# Patient Record
Sex: Female | Born: 1950 | Race: White | Hispanic: No | Marital: Married | Smoking: Never smoker
Health system: Southern US, Community
[De-identification: ages and names within clinical notes are randomized; demographics above are authoritative.]

## PROBLEM LIST (undated history)

## (undated) DIAGNOSIS — Z860100 Personal history of colon polyps, unspecified: Secondary | ICD-10-CM

## (undated) DIAGNOSIS — I10 Essential (primary) hypertension: Secondary | ICD-10-CM

## (undated) DIAGNOSIS — K5730 Diverticulosis of large intestine without perforation or abscess without bleeding: Secondary | ICD-10-CM

## (undated) DIAGNOSIS — K6389 Other specified diseases of intestine: Secondary | ICD-10-CM

## (undated) DIAGNOSIS — E785 Hyperlipidemia, unspecified: Secondary | ICD-10-CM

## (undated) HISTORY — DX: Personal history of colon polyps, unspecified: Z86.0100

## (undated) HISTORY — PX: OTHER SURGICAL HISTORY: SHX169

## (undated) HISTORY — PX: POLYPECTOMY: SHX149

## (undated) HISTORY — PX: COLONOSCOPY: SHX174

## (undated) HISTORY — DX: Personal history of colonic polyps: Z86.010

## (undated) HISTORY — DX: Hyperlipidemia, unspecified: E78.5

## (undated) HISTORY — DX: Diverticulosis of large intestine without perforation or abscess without bleeding: K57.30

## (undated) HISTORY — PX: ABDOMINAL HYSTERECTOMY: SHX81

## (undated) HISTORY — PX: WISDOM TOOTH EXTRACTION: SHX21

## (undated) HISTORY — DX: Other specified diseases of intestine: K63.89

## (undated) HISTORY — DX: Essential (primary) hypertension: I10

---

## 2016-08-28 DIAGNOSIS — K5730 Diverticulosis of large intestine without perforation or abscess without bleeding: Secondary | ICD-10-CM

## 2016-08-28 HISTORY — DX: Diverticulosis of large intestine without perforation or abscess without bleeding: K57.30

## 2016-09-09 DIAGNOSIS — Z860100 Personal history of colon polyps, unspecified: Secondary | ICD-10-CM | POA: Insufficient documentation

## 2016-09-09 DIAGNOSIS — Z1211 Encounter for screening for malignant neoplasm of colon: Secondary | ICD-10-CM | POA: Insufficient documentation

## 2016-12-26 DIAGNOSIS — E785 Hyperlipidemia, unspecified: Secondary | ICD-10-CM | POA: Insufficient documentation

## 2016-12-26 DIAGNOSIS — I10 Essential (primary) hypertension: Secondary | ICD-10-CM | POA: Insufficient documentation

## 2017-06-13 ENCOUNTER — Telehealth: Payer: Self-pay | Admitting: Gastroenterology

## 2017-06-13 NOTE — Telephone Encounter (Signed)
Received referral for patient to have next colon with [facility]. Patient states last colon was April 2018. Patient requesting Dr.Armbruster so referral and previous gi records placed on his desk for review. Pt requesting April appt.

## 2017-06-16 ENCOUNTER — Encounter: Payer: Self-pay | Admitting: Gastroenterology

## 2017-06-16 NOTE — Telephone Encounter (Signed)
Dr.Armbruster reviewed records and accepted patient for an ov. Patient was notified and scheduled.

## 2017-06-27 ENCOUNTER — Other Ambulatory Visit: Payer: Self-pay | Admitting: Physician Assistant

## 2017-06-27 DIAGNOSIS — Z139 Encounter for screening, unspecified: Secondary | ICD-10-CM

## 2017-07-17 ENCOUNTER — Ambulatory Visit
Admission: RE | Admit: 2017-07-17 | Discharge: 2017-07-17 | Disposition: A | Discharge status: Home / Self Care | Payer: Medicare Other | Source: Ambulatory Visit | Attending: Physician Assistant | Admitting: Physician Assistant

## 2017-07-17 ENCOUNTER — Encounter: Payer: Self-pay | Admitting: Radiology

## 2017-07-17 DIAGNOSIS — Z139 Encounter for screening, unspecified: Secondary | ICD-10-CM

## 2017-08-01 NOTE — Progress Notes (Signed)
Referral paperwork loaded

## 2017-08-08 ENCOUNTER — Encounter: Payer: Self-pay | Admitting: Gastroenterology

## 2017-08-08 ENCOUNTER — Ambulatory Visit (INDEPENDENT_AMBULATORY_CARE_PROVIDER_SITE_OTHER): Payer: Medicare Other | Admitting: Gastroenterology

## 2017-08-08 VITALS — BP 110/68 | HR 62 | Ht 66.0 in | Wt 159.2 lb

## 2017-08-08 DIAGNOSIS — K6389 Other specified diseases of intestine: Secondary | ICD-10-CM | POA: Diagnosis not present

## 2017-08-08 DIAGNOSIS — R1013 Epigastric pain: Secondary | ICD-10-CM | POA: Diagnosis not present

## 2017-08-08 MED ORDER — SUPREP BOWEL PREP KIT 17.5-3.13-1.6 GM/177ML PO SOLN: ORAL | 0 refills | Status: DC

## 2017-08-08 NOTE — Progress Notes (Signed)
HPI :  67 year old female with history of diverticulosis, small bowel polyp, hypertension, hyperlipidemia, referred by Nicholes Rough PA to establish GI care and follow-up for small bowel polyp.  She states at baseline she is feeling pretty well without any bowel problems at present.  She denies any abdominal pains.  No constipation, no diarrhea, no blood in the stools.  She has had some ongoing intermittent epigastric pain that bothers her at times.  She thinks some foods can produce this such as sugary foods or drink coffee.  She has tried Zegerid and Zantac in the past which have not helped.  She states she is only taking periodic usage of these and not used daily.  She endorses having hiccups frequently, but denies any heartburn or regurgitation.  She denies any dysphasia.  She had a H. pylori breath test recently which was negative.  She states the symptom has been ongoing on and off for a few years without any clear etiology.  Her last colonoscopy was done in April 2018 in [place].  She was found to have a broad-based 1 cm ileal polyp which was biopsied and reported to have normal mucosa.  She subsequently underwent a CT scan of the abdomen which did not show any small bowel pathology.  She was told to have interval follow-up for this lesion.  She has since moved to [place] and is here to establish care.  Colonoscopy 09/21/2016 - Dr. Amadeo Garnet - sigmoid diverticulum, broad based ileal polyp 86mm in size, otherwise normal - path is NORMAL  CT scan 10/06/2016 - normal, benign hepatic cysts, simple renal cysts, hysterectomy, L3-L5 spondylosis, small umbilical hernia      Past Medical History:  Diagnosis Date  . Diverticulosis of sigmoid colon 08/2016  . History of colon polyps   . Hyperlipemia   . Hypertension      Past Surgical History:  Procedure Laterality Date  . ABDOMINAL HYSTERECTOMY     Family History  Problem Relation Age of Onset  . Diabetes Mother   . Diabetes  Father   . High blood pressure Father   . High Cholesterol Father   . Diabetes Brother    Social History   Tobacco Use  . Smoking status: Never Smoker  . Smokeless tobacco: Never Used  Substance Use Topics  . Alcohol use: Yes    Alcohol/week: 0.6 oz    Types: 1 Glasses of wine per week  . Drug use: No   Current Outpatient Medications  Medication Sig Dispense Refill  . famciclovir (FAMVIR) 500 MG tablet Take by mouth daily.    . irbesartan (AVAPRO) 75 MG tablet Take 75 mg by mouth at bedtime.    . simvastatin (ZOCOR) 40 MG tablet Take 20 mg by mouth daily.    Marland Kitchen spironolactone (ALDACTONE) 25 MG tablet Take 25 mg by mouth daily.     No current facility-administered medications for this visit.    Allergies not on file   Review of Systems: All systems reviewed and negative except where noted in HPI.   No results found for: WBC, HGB, HCT, MCV, PLT  No results found for: CREATININE, BUN, NA, K, CL, CO2   Physical Exam: BP 110/68   Pulse 62   Ht 5\' 6"  (1.676 m)   Wt 159 lb 4 oz (72.2 kg)   BMI 25.70 kg/m  Constitutional: Pleasant,well-developed, female in no acute distress. HEENT: Normocephalic and atraumatic. Conjunctivae are normal. No scleral icterus. Neck supple.  Cardiovascular: Normal rate,  regular rhythm.  Pulmonary/chest: Effort normal and breath sounds normal. No wheezing, rales or rhonchi. Abdominal: Soft, nondistended, nontender. There are no masses palpable. No hepatomegaly. Extremities: no edema Lymphadenopathy: No cervical adenopathy noted. Neurological: Alert and oriented to person place and time. Skin: Skin is warm and dry. No rashes noted. Psychiatric: Normal mood and affect. Behavior is normal.   ASSESSMENT AND PLAN: 67 year old female with history as outlined above here to establish GI care for the following issues:  Small bowel polyp -1 cm ileal polyp noted incidentally on last colonoscopy.  Biopsies at that time were normal and a follow-up CT  scan showed no concerning pathology.  She is asymptomatic other than intermittent epigastric pain.  Given the size of this reported polyp I am recommending a colonoscopy to reassess this area given its been in 1 year since her last exam.  I discussed the risks and benefits of colonoscopy and she want to proceed.  Further recommendations pending this result  Epigastric pain - Intermittent postprandial pain.  Not improved by antacids however she has not had a trial of daily use with these.  Recommend she take her Zegerid every day for 2-4 weeks and see if she notes improvement.  No pathology noted on prior CT scan to account for this.  I otherwise offered her an upper endoscopy to further evaluate the symptoms, can be done at the same time as her colonoscopy.  She wanted to proceed with this exam.  [place] Cellar, MD [facility] Pager [phone]  CC: Nicholes Rough, PA-C

## 2017-08-08 NOTE — Patient Instructions (Addendum)
If you are age 67 or older, your body mass index should be between 23-30. Your Body mass index is 25.7 kg/m. If this is out of the aforementioned range listed, please consider follow up with your Primary Care Provider.  If you are age 67 or younger, your body mass index should be between 19-25. Your Body mass index is 25.7 kg/m. If this is out of the aformentioned range listed, please consider follow up with your Primary Care Provider.   You have been scheduled for an endoscopy and colonoscopy. Please follow the written instructions given to you at your visit today. Please pick up your prep supplies at the pharmacy within the next 1-3 days. If you use inhalers (even only as needed), please bring them with you on the day of your procedure. Your physician has requested that you go to www.startemmi.com and enter the access code given to you at your visit today. This web site gives a general overview about your procedure. However, you should still follow specific instructions given to you by our office regarding your preparation for the procedure.  Continue taking Zegerid, daily for 2 weeks.  Thank you for entrusting me with your care and for choosing [facility], Dr. [place] Cellar

## 2017-08-09 ENCOUNTER — Telehealth: Payer: Self-pay | Admitting: Gastroenterology

## 2017-08-09 NOTE — Telephone Encounter (Signed)
Lynn Thompson I just spoke with her yesterday about this in clinic and she is scheduled for a colonoscopy. Colonoscopy is recommended to assess the lesion given it's been one year since the last exam, I would recommend it over CT scan so further biopsies can be obtained and to get another look at it as it wasn't seen on CT. Recommendations regarding need for removal / surveillance will be based on what is found and biopsies.

## 2017-08-09 NOTE — Telephone Encounter (Signed)
Patient advised of recommendations and reasoning behind this decision. She is very concerned about costs of the procedure and I let her know that I would send Amy H a message so that she can contact her re: insurance information.

## 2017-08-09 NOTE — Telephone Encounter (Signed)
Patient states she had a colonoscopy last year and wants to know if she has to get another one this year, or could she wait on the colon and just get a CT scan. Pt also wondering if she is going to have to start having a colon every year.

## 2017-08-09 NOTE — Telephone Encounter (Signed)
Routed to Dr. Armbruster. 

## 2017-09-27 ENCOUNTER — Encounter: Payer: Self-pay | Admitting: Gastroenterology

## 2017-09-27 ENCOUNTER — Telehealth: Payer: Self-pay | Admitting: Gastroenterology

## 2017-09-27 NOTE — Telephone Encounter (Signed)
Patient wanted to report that for last week she has had fecal incontinence x 2 in the morning. Thought she was passing gas but had very loose stool, describes as mostly "bile". She states has never had this before, has not had it happen during the day. Denies any blood in the stool, no other symptoms. She is scheduled for colonoscopy tomorrow a.m.

## 2017-09-27 NOTE — Telephone Encounter (Signed)
Will discuss with her at time of her procedure tomorrow. Thanks

## 2017-09-27 NOTE — Telephone Encounter (Signed)
Pt wants to speak with you regarding her procedure for tomorrow. She did not want to provide more details.

## 2017-09-28 ENCOUNTER — Other Ambulatory Visit: Payer: Self-pay

## 2017-09-28 ENCOUNTER — Encounter: Payer: Self-pay | Admitting: Gastroenterology

## 2017-09-28 ENCOUNTER — Ambulatory Visit (AMBULATORY_SURGERY_CENTER): Payer: Medicare Other | Admitting: Gastroenterology

## 2017-09-28 VITALS — BP 117/60 | HR 62 | Temp 97.8°F | Resp 15 | Ht 66.0 in | Wt 159.0 lb

## 2017-09-28 DIAGNOSIS — D121 Benign neoplasm of appendix: Secondary | ICD-10-CM

## 2017-09-28 DIAGNOSIS — Z860100 Personal history of colon polyps, unspecified: Secondary | ICD-10-CM

## 2017-09-28 DIAGNOSIS — K388 Other specified diseases of appendix: Secondary | ICD-10-CM | POA: Diagnosis not present

## 2017-09-28 DIAGNOSIS — D1330 Benign neoplasm of unspecified part of small intestine: Secondary | ICD-10-CM

## 2017-09-28 DIAGNOSIS — K6389 Other specified diseases of intestine: Secondary | ICD-10-CM

## 2017-09-28 DIAGNOSIS — D120 Benign neoplasm of cecum: Secondary | ICD-10-CM

## 2017-09-28 MED ORDER — SODIUM CHLORIDE 0.9 % IV SOLN
500.0000 mL | Freq: Once | INTRAVENOUS | Status: DC
Start: 2017-09-28 — End: 2018-04-23

## 2017-09-28 NOTE — Patient Instructions (Signed)
YOU HAD AN ENDOSCOPIC PROCEDURE TODAY AT [facility]:   Refer to the procedure report that was given to you for any specific questions about what was found during the examination.  If the procedure report does not answer your questions, please call your gastroenterologist to clarify.  If you requested that your care partner not be given the details of your procedure findings, then the procedure report has been included in a sealed envelope for you to review at your convenience later.  YOU SHOULD EXPECT: Some feelings of bloating in the abdomen. Passage of more gas than usual.  Walking can help get rid of the air that was put into your GI tract during the procedure and reduce the bloating. If you had a lower endoscopy (such as a colonoscopy or flexible sigmoidoscopy) you may notice spotting of blood in your stool or on the toilet paper. If you underwent a bowel prep for your procedure, you may not have a normal bowel movement for a few days.  Please Note:  You might notice some irritation and congestion in your nose or some drainage.  This is from the oxygen used during your procedure.  There is no need for concern and it should clear up in a day or so.  SYMPTOMS TO REPORT IMMEDIATELY:   Following lower endoscopy (colonoscopy or flexible sigmoidoscopy):  Excessive amounts of blood in the stool  Significant tenderness or worsening of abdominal pains  Swelling of the abdomen that is new, acute  Fever of 100F or higher  For urgent or emergent issues, a gastroenterologist can be reached at any hour by calling [phone].   DIET:  We do recommend a small meal at first, but then you may proceed to your regular diet.  Drink plenty of fluids but you should avoid alcoholic beverages for 24 hours.  ACTIVITY:  You should plan to take it easy for the rest of today and you should NOT DRIVE or use heavy machinery until tomorrow (because of the sedation medicines used during the test).     FOLLOW UP: Our staff will call the number listed on your records the next business day following your procedure to check on you and address any questions or concerns that you may have regarding the information given to you following your procedure. If we do not reach you, we will leave a message.  However, if you are feeling well and you are not experiencing any problems, there is no need to return our call.  We will assume that you have returned to your regular daily activities without incident.  If any biopsies were taken you will be contacted by phone or by letter within the next 1-3 weeks.  Please call us at [phone] if you have not heard about the biopsies in 3 weeks.   Polyp (handout given) QuickClipPro card given with verbal instructions Await for biopsy results   SIGNATURES/CONFIDENTIALITY: You and/or your care partner have signed paperwork which will be entered into your electronic medical record.  These signatures attest to the fact that that the information above on your After Visit Summary has been reviewed and is understood.  Full responsibility of the confidentiality of this discharge information lies with you and/or your care-partner.

## 2017-09-28 NOTE — Op Note (Signed)
[facility] Patient Name: Lynn Thompson Procedure Date: 09/28/2017 8:27 AM MRN: [id number] Endoscopist: Remo Lipps P. Markiesha Delia MD, MD Age: 67 Referring MD:  Date of Birth: [birth year] Gender: Female Account #: [id number] Procedure:                Colonoscopy Indications:              Follow-up for history of small bowel polyp noted on                            prior colonoscopy one year ago Medicines:                Monitored Anesthesia Care Procedure:                Pre-Anesthesia Assessment:                           - Prior to the procedure, a History and Physical                            was performed, and patient medications and                            allergies were reviewed. The patient's tolerance of                            previous anesthesia was also reviewed. The risks                            and benefits of the procedure and the sedation                            options and risks were discussed with the patient.                            All questions were answered, and informed consent                            was obtained. Prior Anticoagulants: The patient has                            taken no previous anticoagulant or antiplatelet                            agents. ASA Grade Assessment: II - A patient with                            mild systemic disease. After reviewing the risks                            and benefits, the patient was deemed in                            satisfactory condition to undergo the procedure.  After obtaining informed consent, the colonoscope                            was passed under direct vision. Throughout the                            procedure, the patient's blood pressure, pulse, and                            oxygen saturations were monitored continuously. The                            Colonoscope was introduced through the anus and                            advanced to the the  terminal ileum, with                            identification of the appendiceal orifice and IC                            valve. The colonoscopy was performed without                            difficulty. The patient tolerated the procedure                            well. The quality of the bowel preparation was                            adequate. The terminal ileum, ileocecal valve,                            appendiceal orifice, and rectum were photographed. Scope In: 8:38:46 AM Scope Out: 9:08:51 AM Scope Withdrawal Time: 0 hours 25 minutes 14 seconds  Total Procedure Duration: 0 hours 30 minutes 5 seconds  Findings:                 The perianal and digital rectal examinations were                            normal.                           The terminal ileum contained one sessile polyp, a                            few cm proximal to the IC valve. The polyp was                            roughly 10-12 mm in diameter. It appeared                            subepithelial with normal overlying mucosa. Bite on  bite biopsies were taken with a cold forceps for                            histology.                           A 8 mm polyp was found in the cecum which involved                            the appendiceal orifice. The polyp was flat. The                            polyp was removed with a cold snare. Resection and                            retrieval were complete. There was some oozing post                            polypectomy, for hemostasis, one hemostatic clip                            was successfully placed across the polypectomy site.                           Multiple small-mouthed diverticula were found in                            the sigmoid colon with sharply angulated turns.                           The exam was otherwise without abnormality. Complications:            No immediate complications. Estimated blood loss:                             Minimal. Estimated Blood Loss:     Estimated blood loss was minimal. Impression:               - One subepithelial ileal polyp in the terminal                            ileum. Biopsied.                           - One 8 mm polyp at the appendiceal orifice,                            removed with a cold snare. Resected and retrieved.                            Clip was placed.                           - Diverticulosis in the sigmoid colon.                           -  The examination was otherwise normal. Recommendation:           - Patient has a contact number available for                            emergencies. The signs and symptoms of potential                            delayed complications were discussed with the                            patient. Return to normal activities tomorrow.                            Written discharge instructions were provided to the                            patient.                           - Resume previous diet.                           - Continue present medications.                           - Await pathology results.                           - Repeat colonoscopy date to be determined after                            pending pathology results are reviewed for                            surveillance. Remo Lipps P. Frederic Tones MD, MD 09/28/2017 9:16:01 AM This report has been signed electronically.

## 2017-09-28 NOTE — Progress Notes (Signed)
Called to room to assist during endoscopic procedure.  Patient ID and intended procedure confirmed with present staff. Received instructions for my participation in the procedure from the performing physician.  

## 2017-09-28 NOTE — Progress Notes (Signed)
Report given to PACU, vss 

## 2017-09-29 ENCOUNTER — Encounter: Payer: Medicare Other | Admitting: Gastroenterology

## 2017-09-29 ENCOUNTER — Telehealth: Payer: Self-pay | Admitting: *Deleted

## 2017-09-29 NOTE — Telephone Encounter (Signed)
  Follow up Call-  Call back number 09/28/2017  Post procedure Call Back phone  # [phone]  Permission to leave phone message Yes     Patient questions:  Do you have a fever, pain , or abdominal swelling? No. Pain Score  0 *  Have you tolerated food without any problems? Yes.    Have you been able to return to your normal activities? Yes.    Do you have any questions about your discharge instructions: Diet   No. Medications  No. Follow up visit  No.  Do you have questions or concerns about your Care? No.  Actions: * If pain score is 4 or above: No action needed, pain <4.

## 2017-11-07 ENCOUNTER — Telehealth: Payer: Self-pay | Admitting: Gastroenterology

## 2017-11-07 NOTE — Telephone Encounter (Signed)
Called and spoke to pt. She wanted to know how far in advance she should call to schedule an MRI Dr. Loni Muse wants her to have in 6 months from her May 2019 colonoscopy.  I indicated we will call her in late October to schedule for early November.  She also said she was given a card on the day of her colonoscopy b/c she had a clip put in her colon and wanted to see if she needs to have a new card each time she travels since the date will become "old". Per Barb Merino, RN she should keep that card for life.  Clip could fall out or stay forever. We do not give out new cards. Pt expressed understanding.

## 2018-04-19 ENCOUNTER — Ambulatory Visit: Payer: Medicare Other | Admitting: Gastroenterology

## 2018-04-23 ENCOUNTER — Encounter: Payer: Self-pay | Admitting: Gastroenterology

## 2018-04-23 ENCOUNTER — Telehealth: Payer: Self-pay | Admitting: Gastroenterology

## 2018-04-23 ENCOUNTER — Ambulatory Visit (INDEPENDENT_AMBULATORY_CARE_PROVIDER_SITE_OTHER): Payer: Medicare Other | Admitting: Gastroenterology

## 2018-04-23 ENCOUNTER — Encounter

## 2018-04-23 VITALS — BP 122/70 | HR 62 | Ht 66.0 in | Wt 163.1 lb

## 2018-04-23 DIAGNOSIS — K6389 Other specified diseases of intestine: Secondary | ICD-10-CM | POA: Diagnosis not present

## 2018-04-23 NOTE — Telephone Encounter (Signed)
Yes that's fine if she tolerates it and it works for her. Thanks

## 2018-04-23 NOTE — Telephone Encounter (Signed)
See note below and advise. 

## 2018-04-23 NOTE — Progress Notes (Signed)
HPI :  67 year old female here for follow-up visit. She had a colonoscopy in April 2018 which showed a 10 mm ileal polyp which appeared subepithelial, normal path. CT scan at that time did not detect the lesion. She had a follow-up colonoscopy with me in May of this year showing a 10-12 mm polyp within a few cm of the IC valve. Bite on bite biopsies were nondiagnostic. She incidentally had a 40mm cecal sessile serrated polyp. I previously discussed her case with advanced endoscopy regarding EUS miniprobe, thought that would unlikely to be of benefit if FNA could not be performed.  She denies any abdominal pains at this time. No changes in her bowel habits. She is overall feeling pretty well without any complaints. She wonders what this lesion represents we discussed that as outlined below. We discussed options.  Colonoscopy 09/28/2017 -  10-66mm polyp at the IC valve, 26mm cecal polyp - bite on bite biopsies negative, sessile serrated polyp   Past Medical History:  Diagnosis Date  . Diverticulosis of sigmoid colon 08/2016  . History of colon polyps   . Hyperlipemia   . Hypertension      Past Surgical History:  Procedure Laterality Date  . ABDOMINAL HYSTERECTOMY    . fibroidectomy    . WISDOM TOOTH EXTRACTION     Family History  Problem Relation Age of Onset  . Diabetes Mother   . Diabetes Father   . High blood pressure Father   . High Cholesterol Father   . Diabetes Brother   . Colon cancer Neg Hx    Social History   Tobacco Use  . Smoking status: Never Smoker  . Smokeless tobacco: Never Used  Substance Use Topics  . Alcohol use: Yes    Alcohol/week: 4.0 standard drinks    Types: 4 Glasses of wine per week  . Drug use: No   Current Outpatient Medications  Medication Sig Dispense Refill  . famciclovir (FAMVIR) 500 MG tablet Take by mouth daily.    . irbesartan (AVAPRO) 75 MG tablet Take 75 mg by mouth at bedtime.    . simvastatin (ZOCOR) 40 MG tablet Take 20 mg by mouth  daily.    Marland Kitchen spironolactone (ALDACTONE) 25 MG tablet Take 25 mg by mouth daily.     No current facility-administered medications for this visit.    Allergies  Allergen Reactions  . Codeine Nausea And Vomiting  . Sulfa Antibiotics     Unknown   . Latex Rash  . Penicillins Rash    Severe "inside and out"     Review of Systems: All systems reviewed and negative except where noted in HPI.   Physical Exam: BP 122/70   Pulse 62   Ht 5\' 6"  (1.676 m)   Wt 163 lb 2 oz (74 kg)   BMI 26.33 kg/m  Constitutional: Pleasant,well-developed, female in no acute distress. HEENT: Normocephalic and atraumatic. Conjunctivae are normal. No scleral icterus. Neck supple.  Cardiovascular: Normal rate, regular rhythm.  Pulmonary/chest: Effort normal and breath sounds normal. No wheezing, rales or rhonchi. Abdominal: Soft, nondistended, nontender. There are no masses palpable. No hepatomegaly. Extremities: no edema Lymphadenopathy: No cervical adenopathy noted. Neurological: Alert and oriented to person place and time. Skin: Skin is warm and dry. No rashes noted. Psychiatric: Normal mood and affect. Behavior is normal.   ASSESSMENT AND PLAN: 67 year old female here for reassessment following issue:  Small bowel polyp - this appears to be a subepithelial lesion at the very distal ileum, a  few cm proximal to the IC valve. It appears relatively stable in size over the past year based on colonoscopy exams, although not sure exactly what this represents. I discussed differential with her. This is likely a benign lesion given its stability but most concerning would be a carcinoid tumor which has potential to spread, etc. I previously discussed her case with advanced endoscopy, while EUS mini probe could potentially better characterize the lesion, FNA not possible. I discussed options moving forward in regards to further characterizing this. I think an MR enterography is a reasonable plan to see if this is  changing over time. I will await that result and discuss her case again with advanced endoscopy. I think this lesion is likely higher risk for perforation if subepithelial in the ileum location if endoscopic removal is considered. I discussed with her a surgical evaluation to discuss elective resection, and she may consider this. For now she wants to wait on the MRI and we'll see what that shows. I will discuss options with her again once I have the results. She agreed  [place] Cellar, MD [facility]

## 2018-04-23 NOTE — Patient Instructions (Signed)
If you are age 67 or older, your body mass index should be between 23-30. Your Body mass index is 26.33 kg/m. If this is out of the aforementioned range listed, please consider follow up with your Primary Care Provider.  If you are age 61 or younger, your body mass index should be between 19-25. Your Body mass index is 26.33 kg/m. If this is out of the aformentioned range listed, please consider follow up with your Primary Care Provider.   You have been scheduled for an MR Enterography of Abdomen and Pelvis with contrast at [facility], located at [address] in the [facility]. Your appointment is scheduled on Tuesday, 05-01-18 at 3:00pm. Please arrive 15 minutes prior to your appointment time for registration purposes. Please make certain not to have anything to eat or drink 4 hours prior to your test. In addition, if you have any metal in your body, have a pacemaker or defibrillator, please be sure to let your ordering physician know. This test typically takes 45 minutes to 1 hour to complete. Should you need to reschedule, please call [phone].   Thank you for entrusting me with your care and for choosing [facility], Dr. Sumner Cellar

## 2018-04-23 NOTE — Telephone Encounter (Signed)
The patient has been notified of this information and all questions answered.

## 2018-04-29 DIAGNOSIS — K835 Biliary cyst: Secondary | ICD-10-CM

## 2018-04-29 HISTORY — DX: Biliary cyst: K83.5

## 2018-05-01 ENCOUNTER — Ambulatory Visit (HOSPITAL_COMMUNITY)
Admission: RE | Admit: 2018-05-01 | Discharge: 2018-05-01 | Disposition: A | Discharge status: Home / Self Care | Payer: Medicare Other | Source: Ambulatory Visit | Attending: Gastroenterology | Admitting: Gastroenterology

## 2018-05-01 ENCOUNTER — Ambulatory Visit (HOSPITAL_COMMUNITY): Payer: Medicare Other

## 2018-05-01 DIAGNOSIS — K6389 Other specified diseases of intestine: Secondary | ICD-10-CM | POA: Diagnosis present

## 2018-05-01 MED ORDER — GADOBUTROL 1 MMOL/ML IV SOLN
7.0000 mL | Freq: Once | INTRAVENOUS | Status: AC | PRN
  Administered 2018-05-01: 7 mL via INTRAVENOUS

## 2018-05-09 ENCOUNTER — Telehealth: Payer: Self-pay | Admitting: Gastroenterology

## 2018-05-09 NOTE — Telephone Encounter (Signed)
Called patient - details in result note of MRI

## 2018-05-09 NOTE — Telephone Encounter (Signed)
Dr Havery Moros have you reviewed the 12/3 imaging?

## 2018-05-10 ENCOUNTER — Other Ambulatory Visit: Payer: Self-pay

## 2018-05-10 DIAGNOSIS — K769 Liver disease, unspecified: Secondary | ICD-10-CM

## 2018-05-11 ENCOUNTER — Telehealth: Payer: Self-pay | Admitting: Gastroenterology

## 2018-05-11 NOTE — Telephone Encounter (Signed)
ROI fax to Dr. [facility] for records.

## 2018-05-16 ENCOUNTER — Ambulatory Visit (HOSPITAL_COMMUNITY)
Admission: RE | Admit: 2018-05-16 | Discharge: 2018-05-16 | Disposition: A | Discharge status: Home / Self Care | Payer: Medicare Other | Source: Ambulatory Visit | Attending: Gastroenterology | Admitting: Gastroenterology

## 2018-05-16 DIAGNOSIS — K769 Liver disease, unspecified: Secondary | ICD-10-CM | POA: Insufficient documentation

## 2018-05-16 LAB — POCT I-STAT CREATININE: CREATININE: 0.9 mg/dL (ref 0.44–1.00)

## 2018-05-16 MED ORDER — GADOXETATE DISODIUM 0.25 MMOL/ML IV SOLN
10.0000 mL | Freq: Once | INTRAVENOUS | Status: AC | PRN
  Administered 2018-05-16: 7 mL via INTRAVENOUS

## 2018-05-16 NOTE — Telephone Encounter (Signed)
Rec'd from Dr. Juliann Mule forwarded 3 pages to Dr. [place] Cellar P

## 2018-05-25 ENCOUNTER — Telehealth: Payer: Self-pay | Admitting: Gastroenterology

## 2018-05-25 NOTE — Telephone Encounter (Signed)
Yes if Dr. Zerita Boers is able to get her in sooner that would be best, but it is not an urgent issue, has likely been present for some time. She can call their office to see if there is anything that opens sooner. If she is not comfortable waiting that long we could otherwise refer her to [facility] to see if they have anything sooner. Thanks

## 2018-05-25 NOTE — Telephone Encounter (Signed)
Please see note below and advise  

## 2018-05-25 NOTE — Telephone Encounter (Signed)
Pt aware.

## 2018-05-25 NOTE — Telephone Encounter (Signed)
Pt was referred out to [facility] to Dr. Barry Dienes.  Pt is sched to see Dr. Barry Dienes in Feb.  Pt would like to know " given her condition, if she should request an earlier appt."  Please advise.

## 2018-05-28 ENCOUNTER — Telehealth: Payer: Self-pay | Admitting: Gastroenterology

## 2018-05-28 NOTE — Telephone Encounter (Signed)
Called [facility] referral line [phone]). Records faxed via Epic to [facility] for review/ scheduling. Scheduler could not say when the appointment would be scheduled. Patient should keep the Dr. Barry Dienes appointment until [facility] lets [place] known when she will be seen. Left a message for patient to for update.

## 2018-05-28 NOTE — Telephone Encounter (Signed)
If she goes to [facility], please specify Ecologist. She should keep her appointment with Dr. Barry Dienes in case [facility] cannot get her in sooner. Thanks [employer]

## 2018-05-28 NOTE — Telephone Encounter (Signed)
Spoke with patient and she would like to see if she can been seen at [facility] quicker than the Feb 6th appointment with Dr. Zerita Boers. Do you have a preference on who she is referred to at [facility]?

## 2018-06-19 ENCOUNTER — Other Ambulatory Visit: Payer: Self-pay | Admitting: Physician Assistant

## 2018-06-19 DIAGNOSIS — Z1231 Encounter for screening mammogram for malignant neoplasm of breast: Secondary | ICD-10-CM

## 2018-07-20 ENCOUNTER — Ambulatory Visit: Payer: Medicare Other

## 2018-08-14 ENCOUNTER — Ambulatory Visit: Payer: Medicare Other

## 2018-09-12 ENCOUNTER — Ambulatory Visit: Payer: Medicare Other

## 2018-10-22 ENCOUNTER — Encounter: Payer: Self-pay | Admitting: Gastroenterology

## 2018-10-31 ENCOUNTER — Ambulatory Visit
Admission: RE | Admit: 2018-10-31 | Discharge: 2018-10-31 | Disposition: A | Discharge status: Home / Self Care | Payer: Medicare Other | Source: Ambulatory Visit | Attending: Physician Assistant | Admitting: Physician Assistant

## 2018-10-31 ENCOUNTER — Other Ambulatory Visit: Payer: Self-pay

## 2018-10-31 DIAGNOSIS — Z1231 Encounter for screening mammogram for malignant neoplasm of breast: Secondary | ICD-10-CM

## 2018-11-05 NOTE — Progress Notes (Signed)
Prescreened pt for 6-9 visit

## 2018-11-06 ENCOUNTER — Telehealth: Payer: Self-pay | Admitting: Gastroenterology

## 2018-11-06 ENCOUNTER — Other Ambulatory Visit: Payer: Self-pay

## 2018-11-06 ENCOUNTER — Ambulatory Visit (INDEPENDENT_AMBULATORY_CARE_PROVIDER_SITE_OTHER): Payer: Medicare Other | Admitting: Gastroenterology

## 2018-11-06 ENCOUNTER — Encounter: Payer: Self-pay | Admitting: Gastroenterology

## 2018-11-06 VITALS — Ht 66.0 in | Wt 156.0 lb

## 2018-11-06 DIAGNOSIS — R932 Abnormal findings on diagnostic imaging of liver and biliary tract: Secondary | ICD-10-CM | POA: Diagnosis not present

## 2018-11-06 DIAGNOSIS — K6389 Other specified diseases of intestine: Secondary | ICD-10-CM | POA: Diagnosis not present

## 2018-11-06 MED ORDER — SUPREP BOWEL PREP KIT 17.5-3.13-1.6 GM/177ML PO SOLN: ORAL | 0 refills | Status: DC

## 2018-11-06 NOTE — Telephone Encounter (Signed)
Patient is returning your call about scheduling a MRI her CB# [phone]

## 2018-11-06 NOTE — Progress Notes (Signed)
THIS ENCOUNTER IS A VIRTUAL VISIT DUE TO COVID-19 - PATIENT WAS NOT SEEN IN THE OFFICE. PATIENT HAS CONSENTED TO VIRTUAL VISIT / TELEMEDICINE VISIT. TELEPHONE ONLY VISIT DONE TODAY.   Location of patient: home Location of provider: office Persons participating: myself, patient, patient's husband  HPI :  68 y/o female here for a follow up visit for small bowel polyp and cystic liver lesion.  She had a colonoscopy in April 2018 which showed a 10 mm ileal polyp which appeared subepithelial, normal path. CT scan at that time did not detect the lesion. She had a follow-up colonoscopy with me in May 2019 showing a 10-12 mm polyp within a few cm of the IC valve. Bite on bite biopsies were nondiagnostic. She incidentally had a 35mm cecal sessile serrated polyp. I previously discussed her case with advanced endoscopy regarding EUS miniprobe, thought that would unlikely to be of benefit if FNA could not be performed. We performed an MRE on 05/02/2018 which showed the ileal polyp, benign appearing, and incidentally noted a septated liver lesion 4.8cm x 4.3cm.   She had a follow up MRI of the liver on 05/16/18, findings as below, concerning for possible cystadenoma. She was referred to Dr. Barry Dienes who recommended surveillance imaging (per patient, note not on file today) and holding off on resection.   The patient feels well, she has no abdominal pains. No problems with the bowel habits. She is eating well weight stable. She has no complaints.   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   Colonoscopy 09/28/2017 -  10-15mm polyp at the IC valve, 76mm cecal polyp - bite on bite biopsies negative, sessile serrated polyp   Past Medical History:  Diagnosis Date   Biliary  cyst 04/2018   MRI   Diverticulosis of sigmoid colon 08/2016   History of colon polyps    Hyperlipemia    Hypertension      Past Surgical History:  Procedure Laterality Date   ABDOMINAL HYSTERECTOMY     fibroidectomy     WISDOM TOOTH EXTRACTION     Family History  Problem Relation Age of Onset   Diabetes Mother    Diabetes Father    High blood pressure Father    High Cholesterol Father    Diabetes Brother    Colon cancer Neg Hx    Esophageal cancer Neg Hx    Stomach cancer Neg Hx    Social History   Tobacco Use   Smoking status: Never Smoker   Smokeless tobacco: Never Used  Substance Use Topics   Alcohol use: Yes    Alcohol/week: 4.0 standard drinks    Types: 4 Glasses of wine per week   Drug use: No   Current Outpatient Medications  Medication Sig Dispense Refill   famciclovir (FAMVIR) 500 MG tablet Take by mouth daily as needed.      irbesartan (AVAPRO) 75 MG tablet Take 75 mg by mouth at bedtime.     simvastatin (ZOCOR) 40 MG tablet Take 20 mg by mouth daily.     spironolactone (ALDACTONE) 25 MG tablet Take 25 mg by mouth daily.     No current facility-administered medications for this visit.    Allergies  Allergen Reactions   Codeine Nausea And Vomiting   Sulfa Antibiotics  Unknown    Latex Rash   Penicillins Rash    Severe "inside and out"     Review of Systems: All systems reviewed and negative except where noted in HPI.    Mm 3d Screen Breast Bilateral  Result Date: 10/31/2018 CLINICAL DATA:  Screening. EXAM: DIGITAL SCREENING BILATERAL MAMMOGRAM WITH TOMO AND CAD COMPARISON:  Previous exam(s). ACR Breast Density Category b: There are scattered areas of fibroglandular density. FINDINGS: There are no findings suspicious for malignancy. Images were processed with CAD. IMPRESSION: No mammographic evidence of malignancy. A result letter of this screening mammogram will be mailed directly to the patient. RECOMMENDATION:  Screening mammogram in one year. (Code:SM-B-01Y) BI-RADS CATEGORY  1: Negative. Electronically Signed   By: Margarette Canada M.D.   On: 10/31/2018 13:11   No results found for: WBC, HGB, HCT, MCV, PLT     Physical Exam: Ht 5\' 6"  (1.676 m) Comment: pt provided over the phone   Wt 156 lb (70.8 kg) Comment: pt provided over the phone   BMI 25.18 kg/m    ASSESSMENT AND PLAN: 68 y/o female here for reassessment of the following:  Abnormal liver imaging / cystic liver lesion - incidentally noted on MRE done to evaluate small bowel lesion. Seen by surgery, Dr. Barry Dienes, who has reportedly recommended close surveillance imaging but I don't see her records today, will obtain those. Patient wants to avoid surgery if possible, as removing the lesion would be a major operation. She understands this could be a pre-cancerous type of cystic lesion. Will order MRI liver with and without contrast to reassess and will have Dr. Barry Dienes review it as well. She needs to have current LFTs, she states she is having a physical with PCP soon and will have it done at that time. She agreed. Further recs pending the result  Small bowel lesion - seems stable in size based on colonoscopy and MRE, unclear etiology. Discussed ddx. Most concerning would be a carcinoid which has potential to spread. I previously discussed with advanced endoscopy, they do not think EUS miniprobe would be useful. Patient states she discussed it with Dr. Barry Dienes and wanted to avoid resection, although remains anxious about this spot. I offered her another surveillance colonoscopy, now about a year from her last exam, to ensure this is stable and take additional bite on bite biopsies. If it stays the same over time will lengthen out surveillance. She agreed.   Bancroft Cellar, MD [facility]

## 2018-11-06 NOTE — Patient Instructions (Addendum)
If you are age 68 or older, your body mass index should be between 23-30. Your Body mass index is 25.18 kg/m. If this is out of the aforementioned range listed, please consider follow up with your Primary Care Provider.  If you are age 23 or younger, your body mass index should be between 19-25. Your Body mass index is 25.18 kg/m. If this is out of the aformentioned range listed, please consider follow up with your Primary Care Provider.   To help prevent the possible spread of infection to our patients, communities, and staff; we will be implementing the following measures:  As of now we are not allowing any visitors/family members to accompany you to any upcoming appointments with [facility]. If you have any concerns about this please contact our office to discuss prior to the appointment.   You have been scheduled for an Liver MRI at [facility], located at [address] in the [facility]. Your appointment is scheduled on Monday, July 6th at 10:00am. Please arrive 30 minutes prior to your appointment time for registration purposes. Please make certain not to have anything to eat or drink 4 hours prior to your test. In addition, if you have any metal in your body, have a pacemaker or defibrillator, please be sure to let your ordering physician know. This test typically takes 45 minutes to 1 hour to complete. Should you need to reschedule, please call [phone].  You have been scheduled for a colonoscopy on Monday, 11-19-18 at 10:00am. Please follow written instructions given to you at your visit today.  Please pick up your prep supplies at the pharmacy within the next 1-3 days. If you use inhalers (even only as needed), please bring them with you on the day of your procedure. Your physician has requested that you go to www.startemmi.com and enter the access code given to you at your visit today. This web site gives a general overview about your procedure. However, you  should still follow specific instructions given to you by our office regarding your preparation for the procedure.   Thank you for entrusting me with your care and for choosing [facility], Dr. Ferris Cellar

## 2018-11-15 ENCOUNTER — Telehealth: Payer: Self-pay | Admitting: Gastroenterology

## 2018-11-16 ENCOUNTER — Telehealth: Payer: Self-pay | Admitting: Gastroenterology

## 2018-11-16 NOTE — Telephone Encounter (Signed)
Attempt to call pt, no answer, no option to leave voice mail.  Covid-19 Screening Questions:  Do you now or have you had a fever in the last 14 days?   Do you have any respiratory symptoms of shortness of breath or cough now or in the last 14 days?    Do you have any family members or close contacts with diagnosed or suspected Covid-19 in the past 14 days?   Have you been tested for Covid-19 and found to be positive?   Pls inform pt that care partner may wait in the car or come up to the lobby during the procedure but will need to provide their own mask.

## 2018-11-19 ENCOUNTER — Ambulatory Visit (AMBULATORY_SURGERY_CENTER): Payer: Medicare Other | Admitting: Gastroenterology

## 2018-11-19 ENCOUNTER — Other Ambulatory Visit: Payer: Self-pay

## 2018-11-19 ENCOUNTER — Encounter: Payer: Self-pay | Admitting: Gastroenterology

## 2018-11-19 VITALS — BP 98/58 | HR 63 | Temp 98.4°F | Resp 14 | Ht 66.0 in | Wt 150.0 lb

## 2018-11-19 DIAGNOSIS — D123 Benign neoplasm of transverse colon: Secondary | ICD-10-CM

## 2018-11-19 DIAGNOSIS — D122 Benign neoplasm of ascending colon: Secondary | ICD-10-CM

## 2018-11-19 DIAGNOSIS — K6389 Other specified diseases of intestine: Secondary | ICD-10-CM

## 2018-11-19 MED ORDER — SODIUM CHLORIDE 0.9 % IV SOLN: 500.0000 mL | Freq: Once | INTRAVENOUS | Status: DC

## 2018-11-19 NOTE — Op Note (Signed)
[place] Patient Name: Ruchel Brandenburger Procedure Date: 11/19/2018 9:50 AM MRN: [id number] Endoscopist: Remo Lipps P. Havery Moros , MD Age: 68 Referring MD:  Date of Birth: 03/27/1951 Gender: Female Account #: [id number] Procedure:                Colonoscopy Indications:              Personal history of small bowel subepithelial                            polypoid lesion, unclear type of lesion, prior                            biopsies nondiagnostic Medicines:                Monitored Anesthesia Care Procedure:                Pre-Anesthesia Assessment:                           - Prior to the procedure, a History and Physical                            was performed, and patient medications and                            allergies were reviewed. The patient's tolerance of                            previous anesthesia was also reviewed. The risks                            and benefits of the procedure and the sedation                            options and risks were discussed with the patient.                            All questions were answered, and informed consent                            was obtained. Prior Anticoagulants: The patient has                            taken no previous anticoagulant or antiplatelet                            agents. ASA Grade Assessment: II - A patient with                            mild systemic disease. After reviewing the risks                            and benefits, the patient was deemed in  satisfactory condition to undergo the procedure.                           After obtaining informed consent, the colonoscope                            was passed under direct vision. Throughout the                            procedure, the patient's blood pressure, pulse, and                            oxygen saturations were monitored continuously. The                            Colonoscope was introduced through the  anus and                            advanced to the the terminal ileum, with                            identification of the appendiceal orifice and IC                            valve. The colonoscopy was performed without                            difficulty. The patient tolerated the procedure                            well. The quality of the bowel preparation was                            adequate. The terminal ileum, ileocecal valve,                            appendiceal orifice, and rectum were photographed. Scope In: 9:57:55 AM Scope Out: 10:25:21 AM Scope Withdrawal Time: 0 hours 20 minutes 47 seconds  Total Procedure Duration: 0 hours 27 minutes 26 seconds  Findings:                 The perianal and digital rectal examinations were                            normal.                           The terminal ileum contained one sessile polyp. The                            polyp was roughly 10 mm in diameter or so, similar                            in size to exam 1 year ago, located within a few cm  of the IC valve. Overlying mucosa appeared inflamed                            / erythematous. Multiple bite on bite biopsies were                            taken with a cold forceps for histology.                           A 3 mm polyp was found in the ascending colon. The                            polyp was flat. The polyp was removed with a cold                            snare. Resection and retrieval were complete.                           A 3 mm polyp was found in the transverse colon. The                            polyp was sessile. The polyp was removed with a                            cold snare. Resection and retrieval were complete.                           Multiple medium-mouthed diverticula were found in                            the sigmoid colon with associated luminal narrowing.                           The exam was otherwise  without abnormality. Complications:            No immediate complications. Estimated blood loss:                            Minimal. Estimated Blood Loss:     Estimated blood loss was minimal. Impression:               - One polypoid lesion in the terminal ileum. Stable                            in size over time however overlying mucosa appeared                            inflamed / erythematous. Biopsied.                           - One 3 mm polyp in the ascending colon, removed  with a cold snare. Resected and retrieved.                           - One 3 mm polyp in the transverse colon, removed                            with a cold snare. Resected and retrieved.                           - Diverticulosis in the sigmoid colon.                           - The examination was otherwise normal. Recommendation:           - Patient has a contact number available for                            emergencies. The signs and symptoms of potential                            delayed complications were discussed with the                            patient. Return to normal activities tomorrow.                            Written discharge instructions were provided to the                            patient.                           - Resume previous diet.                           - Continue present medications.                           - Await pathology results. Remo Lipps P. Havery Moros, MD 11/19/2018 10:32:34 AM This report has been signed electronically.

## 2018-11-19 NOTE — Patient Instructions (Signed)
Red all of the handouts given to you by your recovery room nurse.  YOU HAD AN ENDOSCOPIC PROCEDURE TODAY AT [facility]:   Refer to the procedure report that was given to you for any specific questions about what was found during the examination.  If the procedure report does not answer your questions, please call your gastroenterologist to clarify.  If you requested that your care partner not be given the details of your procedure findings, then the procedure report has been included in a sealed envelope for you to review at your convenience later.  YOU SHOULD EXPECT: Some feelings of bloating in the abdomen. Passage of more gas than usual.  Walking can help get rid of the air that was put into your GI tract during the procedure and reduce the bloating. If you had a lower endoscopy (such as a colonoscopy or flexible sigmoidoscopy) you may notice spotting of blood in your stool or on the toilet paper. If you underwent a bowel prep for your procedure, you may not have a normal bowel movement for a few days.  Please Note:  You might notice some irritation and congestion in your nose or some drainage.  This is from the oxygen used during your procedure.  There is no need for concern and it should clear up in a day or so.  SYMPTOMS TO REPORT IMMEDIATELY:   Following lower endoscopy (colonoscopy or flexible sigmoidoscopy):  Excessive amounts of blood in the stool  Significant tenderness or worsening of abdominal pains  Swelling of the abdomen that is new, acute  Fever of 100F or higher   For urgent or emergent issues, a gastroenterologist can be reached at any hour by calling [phone].   DIET:  We do recommend a small meal at first, but then you may proceed to your regular diet.  Drink plenty of fluids but you should avoid alcoholic beverages for 24 hours. Try to increase the fiber in your diet, and drink plenty of water.  ACTIVITY:  You should plan to take it easy for the  rest of today and you should NOT DRIVE or use heavy machinery until tomorrow (because of the sedation medicines used during the test).    FOLLOW UP: Our staff will call the number listed on your records 48-72 hours following your procedure to check on you and address any questions or concerns that you may have regarding the information given to you following your procedure. If we do not reach you, we will leave a message.  We will attempt to reach you two times.  During this call, we will ask if you have developed any symptoms of COVID 19. If you develop any symptoms (ie: fever, flu-like symptoms, shortness of breath, cough etc.) before then, please call [phone].  If you test positive for Covid 19 in the 2 weeks post procedure, please call and report this information to [place].    If any biopsies were taken you will be contacted by phone or by letter within the next 1-3 weeks.  Please call us at [phone] if you have not heard about the biopsies in 3 weeks.    SIGNATURES/CONFIDENTIALITY: You and/or your care partner have signed paperwork which will be entered into your electronic medical record.  These signatures attest to the fact that that the information above on your After Visit Summary has been reviewed and is understood.  Full responsibility of the confidentiality of this discharge information lies with you and/or your care-partner.

## 2018-11-19 NOTE — Progress Notes (Signed)
To PACU, VSS. Report to [email or web address] 

## 2018-11-19 NOTE — Progress Notes (Signed)
Called to room to assist during endoscopic procedure.  Patient ID and intended procedure confirmed with present staff. Received instructions for my participation in the procedure from the performing physician.  

## 2018-11-21 ENCOUNTER — Ambulatory Visit (HOSPITAL_COMMUNITY): Payer: Medicare Other

## 2018-11-21 ENCOUNTER — Telehealth: Payer: Self-pay | Admitting: *Deleted

## 2018-11-21 NOTE — Telephone Encounter (Signed)
1. Have you developed a fever since your procedure? no  2.   Have you had an respiratory symptoms (SOB or cough) since your procedure? no  3.   Have you tested positive for COVID 19 since your procedure no  4.   Have you had any family members/close contacts diagnosed with the COVID 19 since your procedure?  no   If yes to any of these questions please route to Joylene John, RN and Alphonsa Gin, Therapist, sports.    Follow up Call-  Call back number 11/19/2018 09/28/2017  Post procedure Call Back phone  # [phone]  Permission to leave phone message Yes Yes  Some recent data might be hidden     Patient questions:  Do you have a fever, pain , or abdominal swelling? No. Pain Score  0 *  Have you tolerated food without any problems? Yes.    Have you been able to return to your normal activities? Yes.    Do you have any questions about your discharge instructions: Diet   No. Medications  No. Follow up visit  No.  Do you have questions or concerns about your Care? Yes Patient would like a call from Dr. Havery Moros after her biopsy results are in as she has several questions regarding the results and her LFTs. Will send message to him.  Actions: * If pain score is 4 or above: No action needed, pain <4.

## 2018-12-03 ENCOUNTER — Ambulatory Visit (HOSPITAL_COMMUNITY)
Admission: RE | Admit: 2018-12-03 | Discharge: 2018-12-03 | Disposition: A | Discharge status: Home / Self Care | Payer: Medicare Other | Source: Ambulatory Visit | Attending: Gastroenterology | Admitting: Gastroenterology

## 2018-12-03 ENCOUNTER — Other Ambulatory Visit: Payer: Self-pay

## 2018-12-03 DIAGNOSIS — K6389 Other specified diseases of intestine: Secondary | ICD-10-CM | POA: Insufficient documentation

## 2018-12-03 DIAGNOSIS — R932 Abnormal findings on diagnostic imaging of liver and biliary tract: Secondary | ICD-10-CM | POA: Diagnosis present

## 2018-12-03 LAB — POCT I-STAT CREATININE: Creatinine, Ser: 0.7 mg/dL (ref 0.44–1.00)

## 2018-12-03 MED ORDER — GADOXETATE DISODIUM 0.25 MMOL/ML IV SOLN
7.0000 mL | Freq: Once | INTRAVENOUS | Status: AC
  Administered 2018-12-03: 7 mL via INTRAVENOUS

## 2018-12-21 ENCOUNTER — Telehealth: Payer: Self-pay | Admitting: Gastroenterology

## 2018-12-21 NOTE — Telephone Encounter (Signed)
Patient called said that she would like to know if her Colonoscopy report was sent to Dr. Barry Dienes where she was referred to. She would like a call back for confirmation

## 2018-12-21 NOTE — Telephone Encounter (Signed)
Last office visit, colonoscopy and colonoscopy path report was sent via Epic to Dr Barry Dienes.

## 2018-12-26 ENCOUNTER — Other Ambulatory Visit: Payer: Self-pay | Admitting: General Surgery

## 2018-12-26 DIAGNOSIS — K6389 Other specified diseases of intestine: Secondary | ICD-10-CM

## 2019-01-07 ENCOUNTER — Other Ambulatory Visit (HOSPITAL_COMMUNITY): Payer: Self-pay | Admitting: General Surgery

## 2019-01-07 DIAGNOSIS — D3A8 Other benign neuroendocrine tumors: Secondary | ICD-10-CM

## 2019-01-08 ENCOUNTER — Other Ambulatory Visit: Payer: Medicare Other

## 2019-01-16 ENCOUNTER — Other Ambulatory Visit: Payer: Self-pay

## 2019-01-16 ENCOUNTER — Ambulatory Visit (HOSPITAL_COMMUNITY)
Admission: RE | Admit: 2019-01-16 | Discharge: 2019-01-16 | Disposition: A | Discharge status: Home / Self Care | Payer: Medicare Other | Source: Ambulatory Visit | Attending: General Surgery | Admitting: General Surgery

## 2019-01-16 DIAGNOSIS — D3A8 Other benign neuroendocrine tumors: Secondary | ICD-10-CM | POA: Insufficient documentation

## 2019-01-16 MED ORDER — GALLIUM GA 68 DOTATATE IV KIT
3.6000 | PACK | Freq: Once | INTRAVENOUS | Status: AC | PRN
  Administered 2019-01-16: 3.6 via INTRAVENOUS

## 2019-01-17 NOTE — Progress Notes (Signed)
Please let patient know study is negative which is good.

## 2019-01-28 ENCOUNTER — Telehealth: Payer: Self-pay | Admitting: Gastroenterology

## 2019-01-28 NOTE — Telephone Encounter (Signed)
Lynn Thompson can you relay the following: - I looked at the PET scan, negative for neuroendocrine tumor in regards to the ileal polyp, which is good news. - her MRI done in July showed stability in the liver lesion. I'm not sure what she is referring to in regards to something having grown on the PET, the radiologist did not mention anything in particular. I don't think I have seen Dr. Marlowe Aschoff recommendations if we can obtain those records. Thanks

## 2019-01-28 NOTE — Telephone Encounter (Signed)
Called patient and gave Dr. Doyne Keel review of her PET and MRI. Patient states Dr. Barry Dienes told her there was something small that she saw that should just be checked again in 5 years. Called Dr. Marlowe Aschoff office and requested a copy of her notes from the PET to be faxed to [place] at [phone], to my attention.

## 2019-01-28 NOTE — Telephone Encounter (Signed)
Pt requested Dr. Havery Moros to review results of PET scan and update surveillance recommendation; pt stated that Dr. Laroy Apple and mentioned an indicated growth from results.

## 2019-02-01 NOTE — Telephone Encounter (Signed)
Pt has "a couple more questions" and would like to discuss.

## 2019-02-05 NOTE — Telephone Encounter (Signed)
Left message for patient to please call back if she still has questions

## 2019-06-18 ENCOUNTER — Other Ambulatory Visit: Payer: Self-pay | Admitting: Physician Assistant

## 2019-06-18 DIAGNOSIS — N6320 Unspecified lump in the left breast, unspecified quadrant: Secondary | ICD-10-CM

## 2019-06-18 DIAGNOSIS — N6321 Unspecified lump in the left breast, upper outer quadrant: Secondary | ICD-10-CM

## 2019-06-26 ENCOUNTER — Other Ambulatory Visit: Payer: Self-pay

## 2019-06-26 ENCOUNTER — Ambulatory Visit
Admission: RE | Admit: 2019-06-26 | Discharge: 2019-06-26 | Disposition: A | Discharge status: Home / Self Care | Payer: Medicare Other | Source: Ambulatory Visit | Attending: Physician Assistant | Admitting: Physician Assistant

## 2019-06-26 DIAGNOSIS — N6321 Unspecified lump in the left breast, upper outer quadrant: Secondary | ICD-10-CM

## 2019-06-26 DIAGNOSIS — N6320 Unspecified lump in the left breast, unspecified quadrant: Secondary | ICD-10-CM

## 2019-10-15 ENCOUNTER — Other Ambulatory Visit: Payer: Self-pay | Admitting: Physician Assistant

## 2019-10-15 DIAGNOSIS — Z1231 Encounter for screening mammogram for malignant neoplasm of breast: Secondary | ICD-10-CM

## 2019-11-01 ENCOUNTER — Ambulatory Visit
Admission: RE | Admit: 2019-11-01 | Discharge: 2019-11-01 | Disposition: A | Discharge status: Home / Self Care | Payer: Medicare Other | Source: Ambulatory Visit | Attending: Physician Assistant | Admitting: Physician Assistant

## 2019-11-01 ENCOUNTER — Other Ambulatory Visit: Payer: Self-pay

## 2019-11-01 DIAGNOSIS — Z1231 Encounter for screening mammogram for malignant neoplasm of breast: Secondary | ICD-10-CM

## 2019-11-15 IMAGING — MR MR [PERSON_NAME] PELVIS W/CM
10 of 16 series · 22 of 48 positions shown · IV contrast (Yes)
Comparison: None.

CLINICAL DATA: Small-bowel polyp

EXAM:
MR ABDOMEN AND PELVIS WITHOUT AND WITH CONTRAST (MR ENTEROGRAPHY)
TECHNIQUE: Multiplanar, multisequence MRI of the abdomen and pelvis was
performed both before and during bolus administration of intravenous
contrast. Negative oral contrast VoLumen was given.
CONTRAST:  7 cc MultiHance

[Series 4: T2 · axial · 5.0mm · 0.86mm/px · z∈[-238,+207]mm · 2 of 90 slices shown (1 of 2)]
[im 1/90]
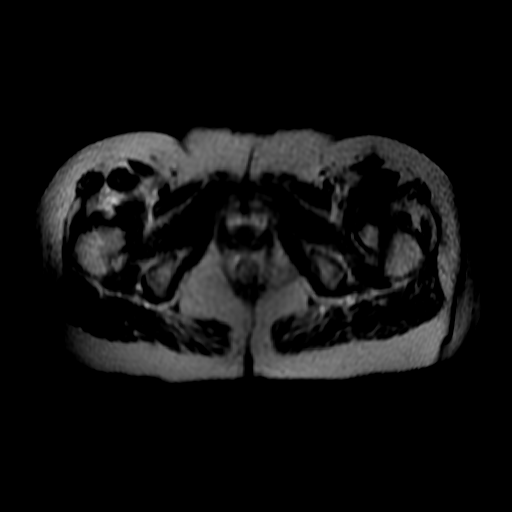
[im 90/90]
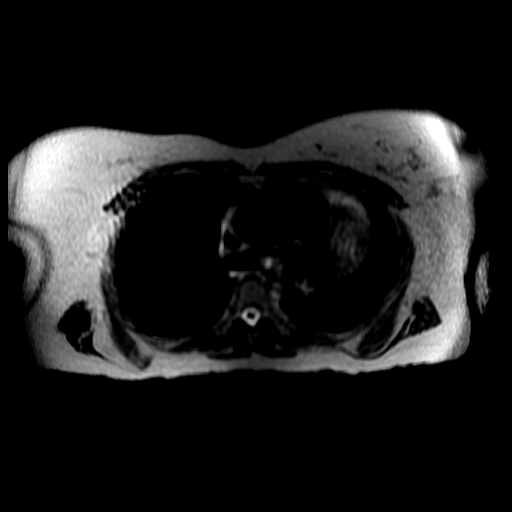

[Series 5: T2 · coronal · 5.0mm · 0.80mm/px · 1 of 34 slices shown (2 of 2)]
[im 1/34]
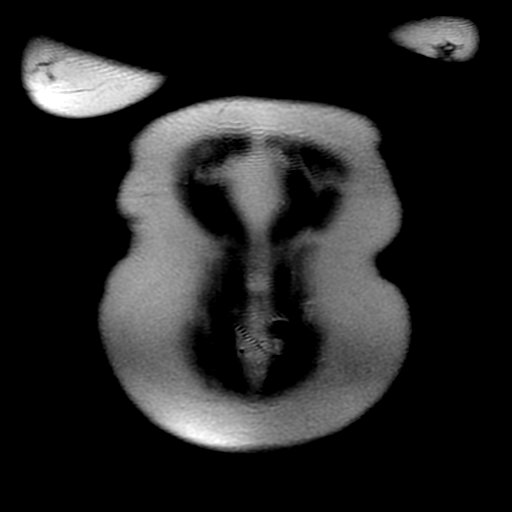

[Series 6: DWI b500 · axial · 6.0mm · 1.56mm/px · z∈[-232,+220]mm · 2 of 118 slices shown]
[im 1/118]
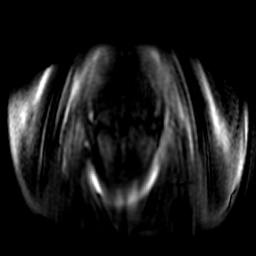
[im 118/118]
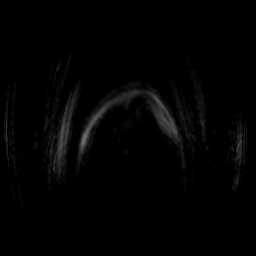

[Series 9: bSSFP · coronal · 5.0mm · 0.78mm/px · 1 of 38 slices shown]
[im 1/38]
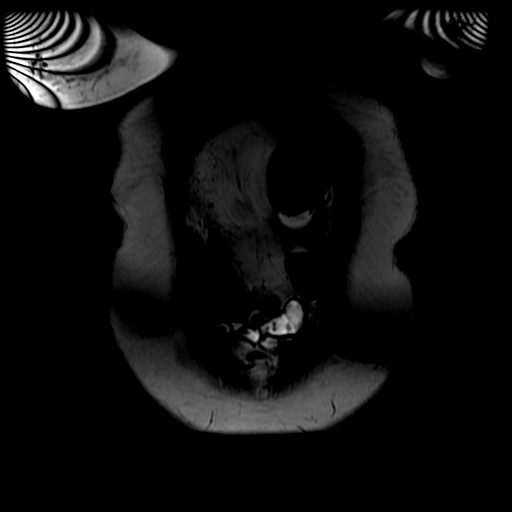

[Series 12: T1 dynamic · coronal · delayed · 4.0mm · 0.78mm/px · 2 of 88 slices shown (1 of 4)]
[im 1/88]
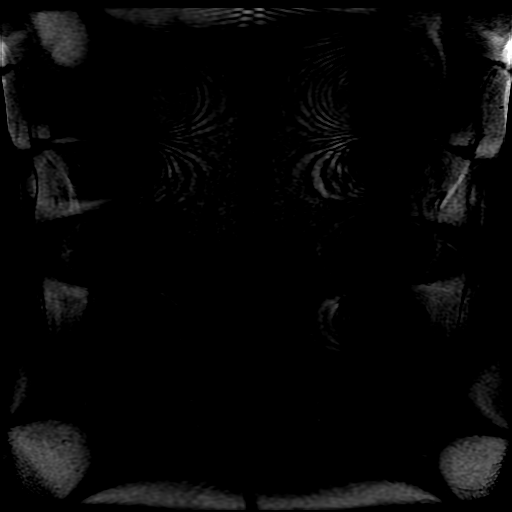
[im 88/88]
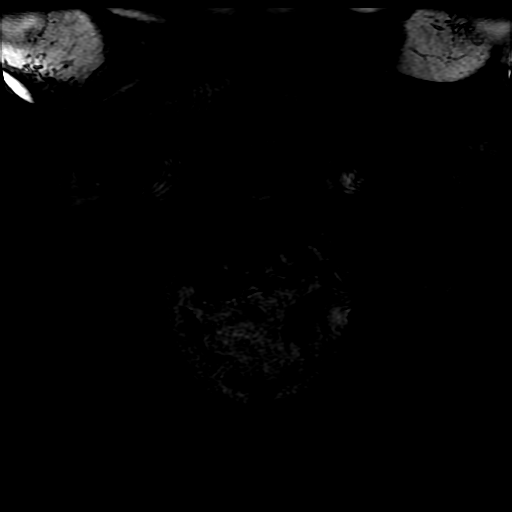

[Series 13: T1 dynamic post-contrast · axial · 6.4mm · 1.56mm/px · z∈[-228,+153]mm · 3 of 120 slices shown]
[im 1/120]
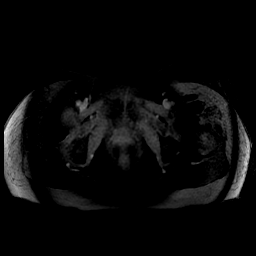
[im 60/120]
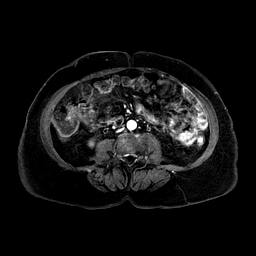
[im 120/120]
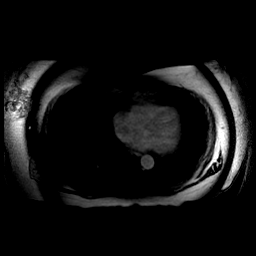

[Series 600: DWI · axial · 6.0mm · 1.56mm/px · 1 of 59 slices shown]
[im 1/59]
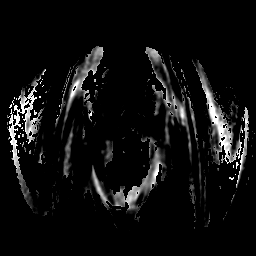

[Series 1100: T1 dynamic · axial · 6.4mm · 0.78mm/px · z∈[-228,+153]mm · 4 of 120 slices shown (2 of 4)]
[im 1/120]
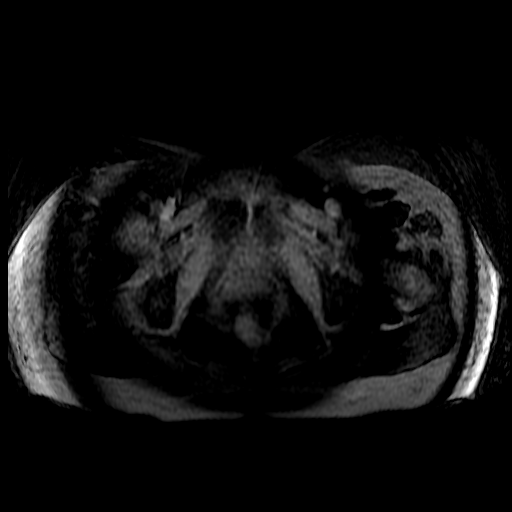
[im 40/120]
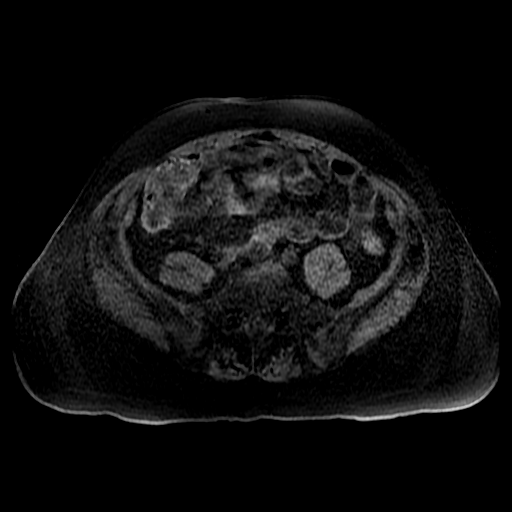
[im 80/120]
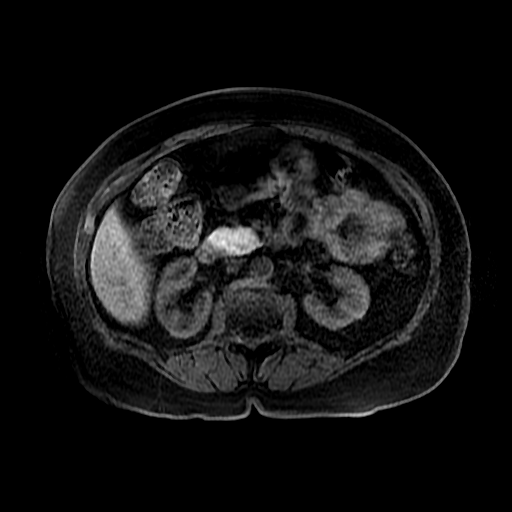
[im 120/120]
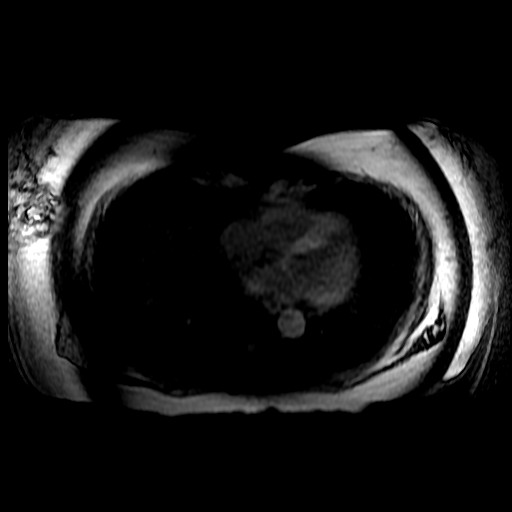

[Series 1101: T1 dynamic · axial · 6.4mm · 0.78mm/px · z∈[-228,+153]mm · 4 of 120 slices shown (3 of 4)]
[im 1/120]
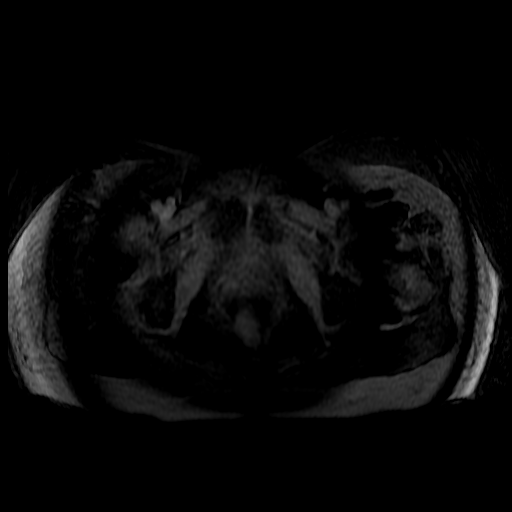
[im 40/120]
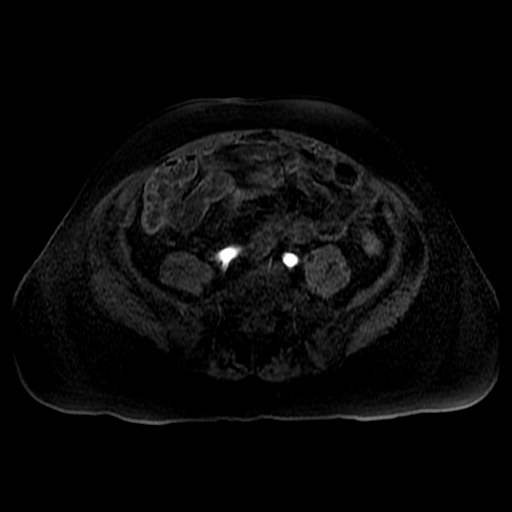
[im 80/120]
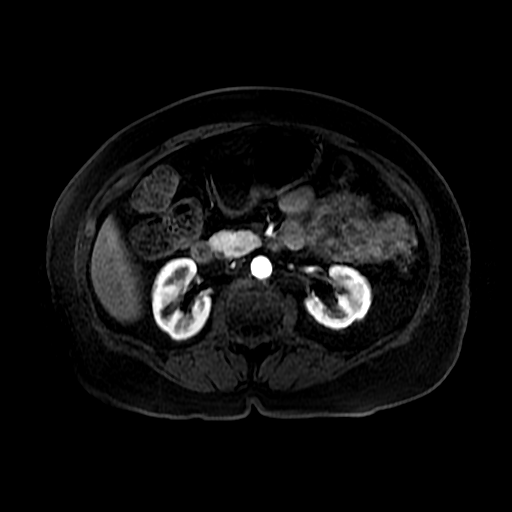
[im 120/120]
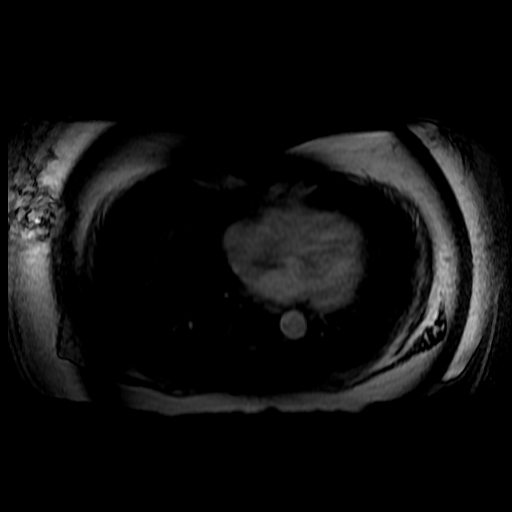

[Series 1102: T1 dynamic · axial · 6.4mm · 0.78mm/px · z∈[-228,-103]mm · 2 of 120 slices shown (4 of 4)]
[im 1/120]
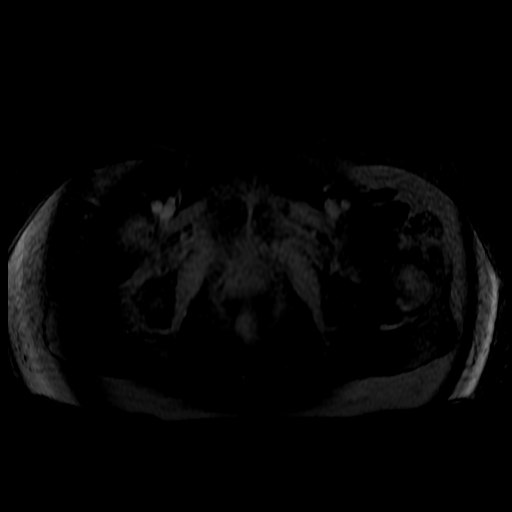
[im 40/120]
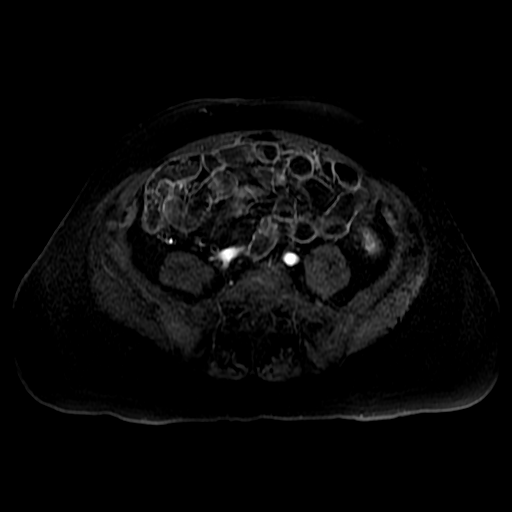

[22 of 48 positions shown; findings below may reference images not displayed]

FINDINGS: COMBINED FINDINGS FOR BOTH MR ABDOMEN AND PELVIS

Lower chest: Unremarkable

Hepatobiliary: 4.8 by 2.1 by 4.3 cm septated cystic lesion in
segment 4 of the liver with several small adjacent tiny cysts in
segment 4 but without significant internal nodularity. 3 mm probable
cyst in segment 6 of the liver. Gallbladder unremarkable. No
findings of cholangitis or significant biliary dilatation.

Pancreas:  Unremarkable

Spleen:  Unremarkable

Adrenals/Urinary Tract: Mild scarring in the left kidney upper pole.
Several tiny renal cysts are observed. Adrenal glands normal.

Stomach/Bowel: The patient was noted to have a small polyp of the
terminal ileum on a prior colonoscopy. This was sessile and about 8
mm in diameter by report, without dysplasia.

On MR enterography, there is expected jejunal and ileal fold pattern
and caliber. On image [DATE] there is a questionable terminal ileum
polyp which could alternatively be due to a band in the bowel wall,
but which likely has some very subtly accentuated enhancement on
image 75/12, and which measures about 7 mm in diameter. No other
similar filling defects are identified in the small bowel.

A few scattered sigmoid colon diverticula are present.

Vascular/Lymphatic:  Unremarkable

Reproductive: Uterus absent.  Adnexa unremarkable.

Other:  No supplemental non-categorized findings.

Musculoskeletal: Lower lumbar degenerative disc disease most notable
at L3-4.
IMPRESSION: 1. 7 mm distal ileal focal wall thickening along a turn in the
bowel, possibly representing the known polyp in this vicinity. No
other similar lesions are identified upon survey of the remainder of
the small bowel.
2. 4.8 by 2.1 by 4.3 cm confluent septated cystic lesion anteriorly
in segment 4 of the liver with several other tiny cystic lesions
observed. No appreciable enhancement except along the faint internal
septations. No internal nodularity. Biliary cystadenoma is favored.
Focal Profetens disease is a possibility although I do not see definite
central Tiger sign and communication with the biliary tree is not
clearly established. Ciliated foregut cyst is unlikely given the
multilocular appearance. Prior infection or hydatid cyst not
entirely excluded. Consider Eovist contrast medium MRI or ERCP to
differentiate focal Profetens disease from the other entities. If this
lesion is shown to definitively not communicate with the biliary
tree, then the possibility of biliary cystadenoma would be further
strengthened and resection would probably be considered.

## 2020-01-21 ENCOUNTER — Other Ambulatory Visit (INDEPENDENT_AMBULATORY_CARE_PROVIDER_SITE_OTHER): Payer: Medicare Other

## 2020-01-21 ENCOUNTER — Ambulatory Visit (INDEPENDENT_AMBULATORY_CARE_PROVIDER_SITE_OTHER): Payer: Medicare Other | Admitting: Gastroenterology

## 2020-01-21 ENCOUNTER — Encounter: Payer: Self-pay | Admitting: Gastroenterology

## 2020-01-21 ENCOUNTER — Ambulatory Visit: Payer: Medicare Other | Admitting: Gastroenterology

## 2020-01-21 VITALS — BP 128/70 | HR 76 | Ht 66.0 in | Wt 145.0 lb

## 2020-01-21 DIAGNOSIS — K7689 Other specified diseases of liver: Secondary | ICD-10-CM | POA: Diagnosis not present

## 2020-01-21 DIAGNOSIS — R194 Change in bowel habit: Secondary | ICD-10-CM

## 2020-01-21 DIAGNOSIS — R932 Abnormal findings on diagnostic imaging of liver and biliary tract: Secondary | ICD-10-CM | POA: Diagnosis not present

## 2020-01-21 DIAGNOSIS — R933 Abnormal findings on diagnostic imaging of other parts of digestive tract: Secondary | ICD-10-CM

## 2020-01-21 DIAGNOSIS — K6389 Other specified diseases of intestine: Secondary | ICD-10-CM

## 2020-01-21 LAB — CBC WITH DIFFERENTIAL/PLATELET
Basophils Absolute: 0.1 10*3/uL (ref 0.0–0.1)
Basophils Relative: 0.8 % (ref 0.0–3.0)
Eosinophils Absolute: 0.2 10*3/uL (ref 0.0–0.7)
Eosinophils Relative: 2.9 % (ref 0.0–5.0)
HCT: 42.7 % (ref 36.0–46.0)
Hemoglobin: 14.7 g/dL (ref 12.0–15.0)
Lymphocytes Relative: 29.2 % (ref 12.0–46.0)
Lymphs Abs: 2.4 10*3/uL (ref 0.7–4.0)
MCHC: 34.3 g/dL (ref 30.0–36.0)
MCV: 91.4 fl (ref 78.0–100.0)
Monocytes Absolute: 0.4 10*3/uL (ref 0.1–1.0)
Monocytes Relative: 5.3 % (ref 3.0–12.0)
Neutro Abs: 5.1 10*3/uL (ref 1.4–7.7)
Neutrophils Relative %: 61.8 % (ref 43.0–77.0)
Platelets: 195 10*3/uL (ref 150.0–400.0)
RBC: 4.67 Mil/uL (ref 3.87–5.11)
RDW: 12.6 % (ref 11.5–15.5)
WBC: 8.2 10*3/uL (ref 4.0–10.5)

## 2020-01-21 LAB — HEPATIC FUNCTION PANEL
ALT: 19 U/L (ref 0–35)
AST: 21 U/L (ref 0–37)
Albumin: 4.3 g/dL (ref 3.5–5.2)
Alkaline Phosphatase: 62 U/L (ref 39–117)
Bilirubin, Direct: 0 mg/dL (ref 0.0–0.3)
Total Bilirubin: 0.4 mg/dL (ref 0.2–1.2)
Total Protein: 7 g/dL (ref 6.0–8.3)

## 2020-01-21 MED ORDER — POLYETHYLENE GLYCOL 3350 17 G PO PACK: 17.0000 g | PACK | Freq: Every day | ORAL | 0 refills | Status: DC | PRN

## 2020-01-21 NOTE — Progress Notes (Signed)
HPI :  69 year old female here for follow-up visit for small bowel polyp and liver lesion.  See prior notes for details of her case. She had a colonoscopy in April 2018 which showed a 10 mm ileal polyp which appeared subepithelial, normal path. CT scan at that time did not detect the lesion. She had a follow-up colonoscopy with me in May 2019 showing a 10-12 mm polyp within a few cm of the IC valve. Bite on bite biopsies were nondiagnostic. She incidentally had a 35mm cecal sessile serrated polyp. I previously discussed her case with advanced endoscopy regarding EUS miniprobe, thought that would unlikely to be of benefit if FNA could not be performed. We performed an MRE on 05/02/2018 which showed the ileal polyp, benign appearing, and incidentally noted a septated liver lesion 4.8cm x 4.3cm. We performed another follow-up colonoscopy in June 2020 which showed stability of the ileal lesion, with nondiagnostic biopsies.  She was seen by Dr. Barry Dienes of general surgery to discuss elective resection.  She recommended a dotatate PET scan which was performed in August 2020 which showed no evidence of carcinoid tumor.  The patient states she has not had any problems with constipation or diarrhea.  No abdominal pain.  She had been taking turmeric 4 caps a day which made her stools loose, she then dose reduced but still had very soft stools which led to excessive wiping of her rectum with bowel movements.  She states she would seeing pink discharge after wiping excessively.  She did not see any blood in her stools.  She stopped the turmeric and her symptoms have improved, she has some mild intermittent constipation that bothers her off turmeric.  She denies any weight loss.  Otherwise she saw Dr. Barry Dienes for liver lesion as well. MRI of the liver on 05/16/18, findings as below, concerning for possible cystadenoma. Dr. Barry Dienes recommended surveillance imaging and holding off on resection. A follow up MRI was done July  2020 which showed stable changes. Radiology recommended another MRI in one year.     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   Colonoscopy 09/28/2017 - 10-69mm polyp at the IC valve, 48mm cecal polyp - bite on bite biopsies negative, sessile serrated polyp  Colonoscopy 11/19/18 - The perianal and digital rectal examinations were normal. - The terminal ileum contained one sessile polyp. The polyp was roughly 10 mm in diameter or so, similar in size to exam 1 year ago, located within a few cm of the IC valve. Overlying mucosa appeared inflamed / erythematous. Multiple bite on bite biopsies were taken with a cold forceps for histology. - A 3 mm polyp was found in the ascending colon. The polyp was flat. The polyp was removed with a cold snare. Resection and retrieval were complete. - A 3 mm polyp was found in the transverse colon. The polyp was sessile. The polyp was removed with a cold snare. Resection and retrieval were complete. - Multiple medium-mouthed diverticula were found in the sigmoid colon with associated luminal narrowing. - The exam was otherwise without abnormality.  1. Surgical [P], transverse, ascending, polyp (2) - TUBULAR ADENOMA(S). - SESSILE SERRATED POLYP WITHOUT CYTOLOGIC DYSPLASIA. - HIGH GRADE DYSPLASIA IS NOT IDENTIFIED. 2. Surgical [P], small bowel, polyp - SUPERFICIAL FRAGMENTS OF INFLAMED POLYPOID SMALL  BOWEL-TYPE MUCOSA. - THERE IS NO EVIDENCE OF CARCINOID TUMOR OR MALIGNANCY.  NM PET Dotatate SCAN - 01/16/19 - IMPRESSION: 1. No focal activity in the ileocecal region to suggest carcinoid tumor. 2. No evidence of metastatic neuroendocrine tumor. 3. No activity in cystic hepatic lesion.   MRI liver 12/03/18 -  IMPRESSION: Lobulated septated cystic lesion in the anterior segment 4 left liver lobe is mildly decreased in size since 05/16/2018 MRI, and demonstrates no aggressive features, favored to be benign. The "cluster of grapes"appearance is not typical of a biliary cystadenoma. A hydatid cyst is a possibility. Continued MRI surveillance suggested in 1 year.        Past Medical History:  Diagnosis Date  . 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 Diverticulosis of sigmoid colon 08/2016  . History of colon polyps   . Hyperlipemia   . Hypertension   . Ileal polyp      Past Surgical History:  Procedure Laterality Date  . ABDOMINAL HYSTERECTOMY    . COLONOSCOPY    . fibroidectomy    . POLYPECTOMY    . WISDOM TOOTH EXTRACTION     Family History  Problem Relation Age of Onset  . Diabetes Mother   . Diabetes Father   . High blood pressure Father   . High Cholesterol Father   . Diabetes Brother   . Colon cancer Neg Hx   . Esophageal cancer Neg Hx   . Stomach cancer Neg Hx   . Colon polyps Neg Hx   . Rectal cancer Neg Hx    Social History   Tobacco Use  . Smoking status: Never Smoker  . Smokeless tobacco: Never Used  Vaping Use  . Vaping Use: Never used  Substance Use Topics  . Alcohol use: Yes    Alcohol/week: 4.0 standard drinks    Types: 4 Glasses of wine per week  . Drug use: No   Current Outpatient Medications  Medication Sig Dispense Refill  . famciclovir (FAMVIR) 500 MG tablet Take by mouth daily as needed.     . irbesartan (AVAPRO) 75 MG tablet Take 75 mg by mouth at bedtime.    . simvastatin (ZOCOR) 40 MG tablet Take 20 mg by mouth daily.    Marland Kitchen spironolactone (ALDACTONE) 25 MG tablet Take 25 mg by mouth daily.    . polyethylene glycol (MIRALAX) 17 g packet Take 17 g by mouth daily as needed. 14 each 0   No current facility-administered medications for this visit.   Allergies  Allergen Reactions  . Codeine Nausea And Vomiting  . Sulfa Antibiotics     Unknown     . Latex Rash  . Penicillins Rash    Severe "inside and out"     Review of Systems: All systems reviewed and negative except where noted in HPI.   Physical Exam: BP 128/70   Pulse 76   Ht 5\' 6"  (1.676 m)   Wt 145 lb (65.8 kg)   SpO2 97%   BMI 23.40 kg/m  Constitutional: Pleasant,well-developed, female in no acute distress. HEENT: Normocephalic and atraumatic. Conjunctivae are normal. No scleral icterus. Neck supple.  Cardiovascular: Normal rate, regular rhythm.  Pulmonary/chest: Effort normal and breath sounds normal. No wheezing, rales or rhonchi. Abdominal: Soft, nondistended, nontender. Bowel sounds active throughout. There are no masses palpable. No hepatomegaly. DRE / Anoscopy - CMA Tia Alert as standby - no obvious fissure, small internal hemorrhoids, no mass lesions Extremities: no edema Lymphadenopathy: No cervical adenopathy noted. Neurological: Alert  and oriented to person place and time. Skin: Skin is warm and dry. No rashes noted. Psychiatric: Normal mood and affect. Behavior is normal.   ASSESSMENT AND PLAN: 69 year old female here for reassessment following:  Change in bowel habits - as above, this appears to be precipitated by turmeric use causing loose stools and perianal irritation.  Anoscopy and DRE today shows no obvious fissure, she does have some small internal hemorrhoids.  She is had a few colonoscopies in recent years which showed no anorectal pathology.  I think symptoms were benign and superficial.  No blood in the stool itself, I do not think the small bowel polyp was related to this.  Provided reassurance, will check CBC to ensure no anemia.  Recommend MiraLAX to take as needed for constipation.  Small bowel polypoid lesion - stable over the past 3 years on imaging and colonoscopy since diagnosis.  Dotatate PET scan argues that this is very unlikely to be a carcinoid which would be the most concerning lesion on the differential in this area.  Reassured  her this is likely some benign subepithelial growth.  Hopefully this does not grow or ever cause her problems.  It is not amenable to EUS with miniprobe after discussion with advanced endoscopy.  She has declined elective surgical resection with general surgery. We will continue to survey this periodically, we discussed perhaps doing another colonoscopy next year, I do not feel strongly need to pursue that right now.  She agreed I will see her next year for reassessment.  Cystic liver lesion / abnormal liver imaging - appears to be a benign hepatic lesion.  As above has been evaluated by surgery and no plans for resection, will continue surveillance with another MRI at this time per radiology recommendations, 1 year out from her last exam.  She agreed.  Jewell Cellar, MD [facility]

## 2020-01-21 NOTE — Patient Instructions (Addendum)
If you are age 69 or older, your body mass index should be between 23-30. Your Body mass index is 23.4 kg/m. If this is out of the aforementioned range listed, please consider follow up with your Primary Care Provider.  If you are age 35 or younger, your body mass index should be between 19-25. Your Body mass index is 23.4 kg/m. If this is out of the aformentioned range listed, please consider follow up with your Primary Care Provider.   You have been scheduled for an MRI of your Liver at [facility] on Wednesday, 01-29-20. Your appointment time is 10:00am. Please arrive 30 minutes prior to your appointment time for registration purposes. Please make certain not to have anything to eat or drink 4 hours prior to your test. In addition, if you have any metal in your body, have a pacemaker or defibrillator, please be sure to let your ordering physician know. This test typically takes 45 minutes to 1 hour to complete. Should you need to reschedule, please call [phone] to do so.   Please go to the lab in the basement of our building to have lab work done as you leave today. Hit "B" for basement when you get on the elevator.  When the doors open the lab is on your left.  We will call you with the results. Thank you.  Due to recent changes in healthcare laws, you may see the results of your imaging and laboratory studies on MyChart before your provider has had a chance to review them.  We understand that in some cases there may be results that are confusing or concerning to you. Not all laboratory results come back in the same time frame and the provider may be waiting for multiple results in order to interpret others.  Please give [place] 48 hours in order for your provider to thoroughly review all the results before contacting the office for clarification of your results.   Take Miralax, over-the-counter, daily as needed.   You will be due for a recall colonoscopy in 12-2020. We will send you a  reminder in the mail when it gets closer to that time.   Thank you for entrusting me with your care and for choosing [facility], Dr. [place] Cellar

## 2020-01-29 ENCOUNTER — Ambulatory Visit (HOSPITAL_COMMUNITY)
Admission: RE | Admit: 2020-01-29 | Discharge: 2020-01-29 | Disposition: A | Discharge status: Home / Self Care | Payer: Medicare Other | Source: Ambulatory Visit | Attending: Gastroenterology | Admitting: Gastroenterology

## 2020-01-29 ENCOUNTER — Other Ambulatory Visit: Payer: Self-pay

## 2020-01-29 DIAGNOSIS — R933 Abnormal findings on diagnostic imaging of other parts of digestive tract: Secondary | ICD-10-CM | POA: Diagnosis not present

## 2020-01-29 MED ORDER — GADOXETATE DISODIUM 0.25 MMOL/ML IV SOLN
10.0000 mL | Freq: Once | INTRAVENOUS | Status: AC | PRN
  Administered 2020-01-29: 7 mL via INTRAVENOUS

## 2020-09-17 ENCOUNTER — Other Ambulatory Visit: Payer: Self-pay | Admitting: Physician Assistant

## 2020-09-17 DIAGNOSIS — Z1231 Encounter for screening mammogram for malignant neoplasm of breast: Secondary | ICD-10-CM

## 2020-11-06 ENCOUNTER — Ambulatory Visit
Admission: RE | Admit: 2020-11-06 | Discharge: 2020-11-06 | Disposition: A | Discharge status: Home / Self Care | Payer: Medicare Other | Source: Ambulatory Visit | Attending: Physician Assistant | Admitting: Physician Assistant

## 2020-11-06 ENCOUNTER — Other Ambulatory Visit: Payer: Self-pay

## 2020-11-06 DIAGNOSIS — Z1231 Encounter for screening mammogram for malignant neoplasm of breast: Secondary | ICD-10-CM

## 2021-01-08 DIAGNOSIS — K529 Noninfective gastroenteritis and colitis, unspecified: Secondary | ICD-10-CM | POA: Insufficient documentation

## 2021-01-15 ENCOUNTER — Other Ambulatory Visit: Payer: Self-pay

## 2021-01-15 DIAGNOSIS — R933 Abnormal findings on diagnostic imaging of other parts of digestive tract: Secondary | ICD-10-CM

## 2021-01-15 NOTE — Progress Notes (Unsigned)
Patient due for MRI of Liver for Septated cystic lesion. Message sent to Radiology, April and Vivien Rota for scheduling.

## 2021-01-18 ENCOUNTER — Telehealth: Payer: Self-pay | Admitting: Gastroenterology

## 2021-01-18 NOTE — Telephone Encounter (Signed)
Patient called back. She was inquiring about the small bowel polypoid lesion Dr. Havery Moros is following. She wanted to know if the MRI is for that too or if Dr. Havery Moros is recommending something different to monitor that. Per his last Office note from 12-2019 he may decide to do another colonoscopy to reevaluate the small bowel polypoid lesion. Patient expressed understanding. She is scheduled for OV on 03-03-21. Patient wished to schedule a colonoscopy procedure in October  on 10-10 to "get it on the books". We can cancel if Dr. Havery Moros advises against it at her 10-5- office visit.

## 2021-01-18 NOTE — Telephone Encounter (Signed)
Called patient and spoke to her husband.  Answered questions about the need to do an MRI yearly to monitor the liver cyst that was originally found on imaging in 2019. Confirmed patient's appointment in October with Dr. Havery Moros.

## 2021-01-18 NOTE — Telephone Encounter (Signed)
Inbound call from patient requesting a call back to discuss what the MRI would be covering as far as liver and intestinal wall. States can leave it on her vm or speak with her husband if she does not answer her phone.

## 2021-02-22 ENCOUNTER — Other Ambulatory Visit: Payer: Self-pay

## 2021-02-22 ENCOUNTER — Ambulatory Visit (HOSPITAL_COMMUNITY)
Admission: RE | Admit: 2021-02-22 | Discharge: 2021-02-22 | Disposition: A | Discharge status: Home / Self Care | Payer: Medicare Other | Source: Ambulatory Visit | Attending: Gastroenterology | Admitting: Gastroenterology

## 2021-02-22 DIAGNOSIS — R933 Abnormal findings on diagnostic imaging of other parts of digestive tract: Secondary | ICD-10-CM | POA: Diagnosis present

## 2021-02-22 MED ORDER — GADOBUTROL 1 MMOL/ML IV SOLN
6.5000 mL | Freq: Once | INTRAVENOUS | Status: AC | PRN
  Administered 2021-02-22: 6.5 mL via INTRAVENOUS

## 2021-03-03 ENCOUNTER — Encounter: Payer: Self-pay | Admitting: Gastroenterology

## 2021-03-03 ENCOUNTER — Ambulatory Visit (INDEPENDENT_AMBULATORY_CARE_PROVIDER_SITE_OTHER): Payer: Medicare Other | Admitting: Gastroenterology

## 2021-03-03 VITALS — BP 120/80 | HR 82 | Ht 66.0 in | Wt 147.0 lb

## 2021-03-03 DIAGNOSIS — K6389 Other specified diseases of intestine: Secondary | ICD-10-CM | POA: Diagnosis not present

## 2021-03-03 DIAGNOSIS — K7689 Other specified diseases of liver: Secondary | ICD-10-CM | POA: Diagnosis not present

## 2021-03-03 MED ORDER — SUTAB 1479-225-188 MG PO TABS: 1.0000 | ORAL_TABLET | Freq: Once | ORAL | 0 refills | Status: AC

## 2021-03-03 NOTE — Patient Instructions (Addendum)
If you are age 70 or older, your body mass index should be between 23-30. Your Body mass index is 23.73 kg/m. If this is out of the aforementioned range listed, please consider follow up with your Primary Care Provider.  If you are age 55 or younger, your body mass index should be between 19-25. Your Body mass index is 23.73 kg/m. If this is out of the aformentioned range listed, please consider follow up with your Primary Care Provider.   __________________________________________________________  The [facility] providers would like to encourage you to use [facility] to communicate with providers for non-urgent requests or questions.  Due to long hold times on the telephone, sending your provider a message by [facility] may be a faster and more efficient way to get a response.  Please allow 48 business hours for a response.  Please remember that this is for non-urgent requests.   You have been scheduled for a colonoscopy. Please follow written instructions given to you at your visit today.  Please pick up your prep supplies at the pharmacy within the next 1-3 days. If you use inhalers (even only as needed), please bring them with you on the day of your procedure.  You will be due for an office visit in 1 year.  We will remind you when it is time to schedule.  Thank you for entrusting me with your care and for choosing [facility], Dr. Howe Cellar     .

## 2021-03-03 NOTE — Progress Notes (Signed)
HPI :  70 year old female here for follow-up visit for small bowel polyp and liver lesion.   See prior notes for details of her case. She had a colonoscopy in April 2018 in [place] which showed a 10 mm ileal polyp which appeared subepithelial, normal path. CT scan at that time did not detect the lesion. She had a follow-up colonoscopy with me in May 2019 showing a 10-12 mm polyp within a few cm of the IC valve. Bite on bite biopsies were nondiagnostic. She incidentally had a 36m cecal sessile serrated polyp. I previously discussed her case with advanced endoscopy regarding EUS miniprobe, thought that would unlikely to be of benefit if FNA could not be performed. We performed an MRE on 05/02/2018 which showed the ileal lesion, benign appearing, and incidentally noted a septated liver lesion 4.8cm x 4.3cm. We performed another follow-up colonoscopy in June 2020 which showed stability of the ileal lesion, with nondiagnostic biopsies.  She was seen by Dr. BBarry Dienesof general surgery to discuss elective resection.  She recommended a dotatate PET scan which was performed in August 2020 which showed no evidence of carcinoid tumor.   Patient here for follow-up with her husband today.  She states she has had about 3 weeks of loose stools during July and then it resolved on its own.  She did not find any clear triggers to cause that.  Her bowels are now back at baseline and she does not have any abdominal pains that bother her.  No blood in her stool.  She is not taking any medications.  Denies any cardiopulmonary symptoms.  We discussed when she wanted to proceed with surveillance colonoscopy.  Been almost 2 and half years since her last exam, she has already scheduled an exam to be done this month, she wanted to get this and while she has met her deductible as opposed to doing this next summer which would be about 3 years from her last exam.  She had an MRI of her liver since I have last seen her as well, done last  month.  She had no interval enlargement of the cystic lesion in her liver, in fact it was slightly smaller than when first seen on imaging.  Recall she has seen Dr. BBarry Dienesof general surgery to discuss this in the past who did not recommend resection or biopsy but only surveillance.  Patient is otherwise feeling well without complaints.   Prior workup: MRE done 05/02/2018 - suspected ileal polyp noted, incidentally noted septated lesion within the liver, 4.8cm x 4.3cm   MRI liver 05/16/18 - irregular multicystic lesion of segment IV of the left liver with numerous septations, concerning for cystadenoma per radiology.    Colonoscopy 09/28/2017 -  10-161mpolyp at the IC valve, 83m483mecal polyp - bite on bite biopsies negative, sessile serrated polyp   Colonoscopy 11/19/18 - The perianal and digital rectal examinations were normal. - The terminal ileum contained one sessile polyp. The polyp was roughly 10 mm in diameter or so, similar in size to exam 1 year ago, located within a few cm of the IC valve. Overlying mucosa appeared inflamed / erythematous. Multiple bite on bite biopsies were taken with a cold forceps for histology. - A 3 mm polyp was found in the ascending colon. The polyp was flat. The polyp was removed with a cold snare. Resection and retrieval were complete. - A 3 mm polyp was found in the transverse colon. The polyp was sessile. The polyp was  removed with a cold snare. Resection and retrieval were complete. - Multiple medium-mouthed diverticula were found in the sigmoid colon with associated luminal narrowing. - The exam was otherwise without abnormality.   1. Surgical [P], transverse, ascending, polyp (2) - TUBULAR ADENOMA(S). - SESSILE SERRATED POLYP WITHOUT CYTOLOGIC DYSPLASIA. - HIGH GRADE DYSPLASIA IS NOT IDENTIFIED. 2. Surgical [P], small bowel, polyp - SUPERFICIAL FRAGMENTS OF INFLAMED POLYPOID SMALL BOWEL-TYPE MUCOSA. - THERE IS NO EVIDENCE OF CARCINOID TUMOR OR  MALIGNANCY.   NM PET Dotatate SCAN - 01/16/19 - IMPRESSION: 1. No focal activity in the ileocecal region to suggest carcinoid tumor. 2. No evidence of metastatic neuroendocrine tumor. 3. No activity in cystic hepatic lesion.     MRI liver 12/03/18 - IMPRESSION: Lobulated septated cystic lesion in the anterior segment 4 left liver lobe is mildly decreased in size since 05/16/2018 MRI, and demonstrates no aggressive features, favored to be benign. The "cluster of grapes"appearance is not typical of a biliary cystadenoma. A hydatid cyst is a possibility. Continued MRI surveillance suggested in 1 year.    MRI liver 02/22/21: IMPRESSION: 3.1 cm multiloculated cystic lesion in segment 4 of the left hepatic lobe remains stable since most recent exam, and shows mild decrease in size since original study in 2019. This suggests a benign etiology, such as a biliary cystadenoma (mucinous cystic neoplasm). Due to risk of malignant transformation, surgical consultation is suggested.  MRI liver 01/29/20: IMPRESSION: 1. Clustered subcapsular cystic lesion in segment 4 of the liver is stable from 12/03/2018 but mildly reduced in size compared to the original exam from 05/01/2018. 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 Conceivably this could be a postinflammatory cystic lesion such as hydatid cyst. This would be unusual for a biliary cystadenoma. Overall given the lack of accentuated activity on PET-CT and the mild reduction in size over the last 2 years, a benign process is favored. Consider surveillance imaging by ultrasound or other cross-sectional imaging modality in 12 months time. 2. Small bilateral renal cysts. 3. Lower lumbar spondylosis, degenerative disc disease, and mild scoliosis.      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   MRI   Diverticulosis of sigmoid colon 08/2016    History of colon polyps    Hyperlipemia    Hypertension    Ileal polyp      Past Surgical History:  Procedure Laterality Date   ABDOMINAL HYSTERECTOMY     COLONOSCOPY     fibroidectomy     POLYPECTOMY     WISDOM TOOTH EXTRACTION     Family History  Problem Relation Age of Onset   Diabetes Mother    Diabetes Father    High blood pressure Father    High Cholesterol Father    Diabetes Brother    Colon cancer Neg Hx    Esophageal cancer Neg Hx    Stomach cancer Neg Hx    Colon polyps Neg Hx    Rectal cancer Neg Hx    Social History   Tobacco Use   Smoking status: Never   Smokeless tobacco: Never  Vaping Use   Vaping Use: Never used  Substance Use Topics   Alcohol use: Yes    Alcohol/week: 4.0 standard drinks    Types: 4 Glasses of wine per week   Drug use: No   Current Outpatient Medications  Medication Sig Dispense Refill   famciclovir (FAMVIR) 500 MG tablet Take  by mouth daily as needed.      irbesartan (AVAPRO) 75 MG tablet Take 75 mg by mouth at bedtime.     simvastatin (ZOCOR) 40 MG tablet Take 20 mg by mouth daily.     spironolactone (ALDACTONE) 25 MG tablet Take 25 mg by mouth daily.     No current facility-administered medications for this visit.   Allergies  Allergen Reactions   Codeine Nausea And Vomiting   Sulfa Antibiotics     Unknown    Latex Rash   Penicillins Rash    Severe "inside and out"     Review of Systems: All systems reviewed and negative except where noted in HPI.    MR LIVER W WO CONTRAST  Result Date: 02/23/2021 CLINICAL DATA:  Follow-up complex hepatic cystic lesion. EXAM: MRI ABDOMEN WITHOUT AND WITH CONTRAST TECHNIQUE: Multiplanar multisequence MR imaging of the abdomen was performed both before and after the administration of intravenous contrast. CONTRAST:  6.39m GADAVIST GADOBUTROL 1 MMOL/ML IV SOLN COMPARISON:  01/29/2020 and earlier studies dating back to 05/16/2018 FINDINGS: Lower chest: No acute findings.  Hepatobiliary: A multiloculated cystic lesion is again seen in segment 4 of the left hepatic lobe which contains numerous internal septations, but no solid component. This lesion measures 3.1 x 1.7 cm on image 11/30, unchanged since most recent exam in 2021, and decreased in size from earliest study in 2019. A few other tiny sub-cm cysts are again seen in both right and left lobes. No solid hepatic masses identified. Gallbladder is unremarkable. No evidence of biliary ductal dilatation. Pancreas: No mass or inflammatory changes. No evidence of pancreatic ductal dilatation or pancreas divisum. Spleen:  Within normal limits in size and appearance. Adrenals/Urinary Tract: Multiple tiny sub-cm cysts are again seen in both kidneys. No masses identified. No evidence of hydronephrosis. Stomach/Bowel: Visualized portion unremarkable. Vascular/Lymphatic: No pathologically enlarged lymph nodes identified. No acute vascular findings. Other:  None. Musculoskeletal:  No suspicious bone lesions identified. IMPRESSION: 3.1 cm multiloculated cystic lesion in segment 4 of the left hepatic lobe remains stable since most recent exam, and shows mild decrease in size since original study in 2019. This suggests a benign etiology, such as a biliary cystadenoma (mucinous cystic neoplasm). Due to risk of malignant transformation, surgical consultation is suggested. Electronically Signed   By: JMarlaine HindM.D.   On: 02/23/2021 08:56    Lab Results  Component Value Date   WBC 8.2 01/21/2020   HGB 14.7 01/21/2020   HCT 42.7 01/21/2020   MCV 91.4 01/21/2020   PLT 195.0 01/21/2020    Lab Results  Component Value Date   CREATININE 0.70 12/03/2018     Lab Results  Component Value Date   ALT 19 01/21/2020   AST 21 01/21/2020   ALKPHOS 62 01/21/2020   BILITOT 0.4 01/21/2020     Physical Exam: BP 120/80   Pulse 82   Ht _0  (1.676 m)   Wt 147 lb (66.7 kg)   BMI 23.73 kg/m  Constitutional: Pleasant,well-developed,  female in no acute distress. Neurological: Alert and oriented to person place and time. Psychiatric: Normal mood and affect. Behavior is normal.   ASSESSMENT AND PLAN: 70year old female here for reassessment of following:  Subepithelial ileal lesion Cystic liver lesion /abnormal liver imaging  As above, patient had this incidentally noted during a colonoscopy in 2018.  A few exams since then have not shown any interval growth in a few years.  We discussed differential diagnosis for this, likely a  benign and slow-growing lesion which hopefully never cause her any problems.  Given the negative dotatate PET scan that would argue against a carcinoid lesion which would be the most concerning thing in this area.  Is not unfortunately amenable to EUS with miniprobe.  She has met with surgery and declined resection.  She does remain anxious about this and wished to have some surveillance, her last exam was benign almost 2 and half years.  We discussed doing it now versus next summer, she wishes to proceed now as she is currently met her deductible for her insurance.  If this exam shows continued stability will space out even further from her last exam.  Otherwise the lesion in her liver appears to be benign, question if this is a precancerous lesion however.  She is already met with Dr. Barry Dienes who did not recommend surgery nor biopsy.  We will continue to survey this over time however may space out frequency of her imaging.  Plan: - reassured patient given workup to date of both ileal and hepatic lesions so far - Colonoscopy to reassess ileal lesion - consider surveillance  MRI liver in 1-2 years, she will see me in one year to discuss this further  Jolly Mango, MD [facility]

## 2021-03-05 ENCOUNTER — Telehealth: Payer: Self-pay | Admitting: Gastroenterology

## 2021-03-05 NOTE — Telephone Encounter (Signed)
Inbound call from pt requesting a call back stating that she has questions regarding her prep to make sure they are in sync with her allergies. Pt has procedure on Monday. Please advise. Thank you.

## 2021-03-08 ENCOUNTER — Other Ambulatory Visit: Payer: Self-pay

## 2021-03-08 ENCOUNTER — Encounter: Payer: Self-pay | Admitting: Gastroenterology

## 2021-03-08 ENCOUNTER — Ambulatory Visit (AMBULATORY_SURGERY_CENTER): Payer: Medicare Other | Admitting: Gastroenterology

## 2021-03-08 VITALS — BP 139/58 | HR 69 | Temp 97.1°F | Resp 16 | Ht 66.0 in | Wt 147.0 lb

## 2021-03-08 DIAGNOSIS — K635 Polyp of colon: Secondary | ICD-10-CM | POA: Diagnosis not present

## 2021-03-08 DIAGNOSIS — K639 Disease of intestine, unspecified: Secondary | ICD-10-CM | POA: Diagnosis not present

## 2021-03-08 DIAGNOSIS — Z86010 Personal history of colonic polyps: Secondary | ICD-10-CM

## 2021-03-08 DIAGNOSIS — K6389 Other specified diseases of intestine: Secondary | ICD-10-CM

## 2021-03-08 MED ORDER — SODIUM CHLORIDE 0.9 % IV SOLN: 500.0000 mL | Freq: Once | INTRAVENOUS | Status: DC

## 2021-03-08 NOTE — Telephone Encounter (Signed)
Sorry to hear this. I was unaware of this and was out of the office on Friday, just seeing this now. I don't see anything documented in her chart that she called [place] after her visit.

## 2021-03-08 NOTE — Telephone Encounter (Signed)
Called and spoke to patient. She was concerned that she has a sulfa allergy and she saw on the box of Sutab that it contains magnesium sulfate, potassium chloride, and sodium sulfate and wanted to make sure it was safe for her to start her prep over the weekend. She was very upset no one called her back last Friday when I was out of the office. (Out of Office was on).She indicates she called back when she didn't hear back from me and spoke to someone that told her she could call over the weekend and get the doctor on call. She said she spoke to a doctor who called and spoke to a pharmacist who verified she would be OK to take Sutab.  She has been prepping yesterday and today and is doing OK.  All other questions about prep and the procedure answered. Patient expressed frustration and disappointment about not hearing back from anyone given the nature and timing of her request in relation to her procedure this morning.

## 2021-03-08 NOTE — Progress Notes (Signed)
Called to room to assist during endoscopic procedure.  Patient ID and intended procedure confirmed with present staff. Received instructions for my participation in the procedure from the performing physician.  

## 2021-03-08 NOTE — Op Note (Signed)
Lynn Thompson Patient Name: Stepahnie Thompson Procedure Date: 03/08/2021 2:55 PM MRN: [id number] Endoscopist: Remo Lipps P. Havery Moros , MD Age: 70 Referring MD:  Date of Birth: 11-04-1950 Gender: Female Account #: [id number] Procedure:                Colonoscopy Indications:              High risk colon cancer surveillance: Personal                            history of polyps - subepithelial nodule / polyp                            followed over time, stable since 2018, last exam                            2.5 years ago, Dotate PET negative for carcinoid.                            Patient has declined elective resection, wishes to                            pursue surveillance Medicines:                Monitored Anesthesia Care Procedure:                Pre-Anesthesia Assessment:                           - Prior to the procedure, a History and Physical                            was performed, and patient medications and                            allergies were reviewed. The patient's tolerance of                            previous anesthesia was also reviewed. The risks                            and benefits of the procedure and the sedation                            options and risks were discussed with the patient.                            All questions were answered, and informed consent                            was obtained. Prior Anticoagulants: The patient has                            taken no previous anticoagulant or antiplatelet  agents. ASA Grade Assessment: II - A patient with                            mild systemic disease. After reviewing the risks                            and benefits, the patient was deemed in                            satisfactory condition to undergo the procedure.                           After obtaining informed consent, the colonoscope                            was passed under direct vision. Throughout  the                            procedure, the patient's blood pressure, pulse, and                            oxygen saturations were monitored continuously. The                            Olympus PCF-[id number] (#[id number]) Colonoscope was                            introduced through the anus and advanced to the the                            terminal ileum, with identification of the                            appendiceal orifice and IC valve. The colonoscopy                            was performed without difficulty. The patient                            tolerated the procedure well. The quality of the                            bowel preparation was good. The terminal ileum,                            ileocecal valve, appendiceal orifice, and rectum                            were photographed. Scope In: 2:59:30 PM Scope Out: 3:19:34 PM Scope Withdrawal Time: 0 hours 13 minutes 54 seconds  Total Procedure Duration: 0 hours 20 minutes 4 seconds  Findings:                 The perianal and digital rectal examinations were  normal.                           The terminal ileum contained one sessile                            subepithelial polypoid lesion. The lesion was                            roughly 10 mm in diameter, no significant changes                            since it was last evaluated. Bite on bite biopsies                            were taken with a cold forceps for histology.                           A diminutive polyp was found in the ascending                            colon. The polyp was sessile. The polyp was removed                            with a cold biopsy forceps. Resection and retrieval                            were complete.                           A few small-mouthed diverticula were found in the                            sigmoid colon.                           The exam was otherwise without abnormality. Complications:             No immediate complications. Estimated blood loss:                            Minimal. Estimated Blood Loss:     Estimated blood loss was minimal. Impression:               - One polypoid lesion in the terminal ileum as                            outlined above, no significant interval changes.                            Biopsied.                           - One diminutive polyp in the ascending colon,  removed with a cold biopsy forceps. Resected and                            retrieved.                           - Diverticulosis in the sigmoid colon.                           - The examination was otherwise normal. Recommendation:           - Patient has a contact number available for                            emergencies. The signs and symptoms of potential                            delayed complications were discussed with the                            patient. Return to normal activities tomorrow.                            Written discharge instructions were provided to the                            patient.                           - Resume previous diet.                           - Continue present medications.                           - Await pathology results. Remo Lipps P. Yarieliz Wasser, MD 03/08/2021 3:24:37 PM This report has been signed electronically.

## 2021-03-08 NOTE — Patient Instructions (Signed)
Impression/Recommendations:  Polyp and diverticulosis handouts given to patient.  Resume previous diet. Continue present medications. Await pathology results.  YOU HAD AN ENDOSCOPIC PROCEDURE TODAY AT THE [facility]:   Refer to the procedure report that was given to you for any specific questions about what was found during the examination.  If the procedure report does not answer your questions, please call your gastroenterologist to clarify.  If you requested that your care partner not be given the details of your procedure findings, then the procedure report has been included in a sealed envelope for you to review at your convenience later.  YOU SHOULD EXPECT: Some feelings of bloating in the abdomen. Passage of more gas than usual.  Walking can help get rid of the air that was put into your GI tract during the procedure and reduce the bloating. If you had a lower endoscopy (such as a colonoscopy or flexible sigmoidoscopy) you may notice spotting of blood in your stool or on the toilet paper. If you underwent a bowel prep for your procedure, you may not have a normal bowel movement for a few days.  Please Note:  You might notice some irritation and congestion in your nose or some drainage.  This is from the oxygen used during your procedure.  There is no need for concern and it should clear up in a day or so.  SYMPTOMS TO REPORT IMMEDIATELY:   Following lower endoscopy (colonoscopy or flexible sigmoidoscopy):  Excessive amounts of blood in the stool  Significant tenderness or worsening of abdominal pains  Swelling of the abdomen that is new, acute  Fever of 100F or higher  For urgent or emergent issues, a gastroenterologist can be reached at any hour by calling [phone]. Do not use MyChart messaging for urgent concerns.    DIET:  We do recommend a small meal at first, but then you may proceed to your regular diet.  Drink plenty of fluids but you should avoid  alcoholic beverages for 24 hours.  ACTIVITY:  You should plan to take it easy for the rest of today and you should NOT DRIVE or use heavy machinery until tomorrow (because of the sedation medicines used during the test).    FOLLOW UP: Our staff will call the number listed on your records 48-72 hours following your procedure to check on you and address any questions or concerns that you may have regarding the information given to you following your procedure. If we do not reach you, we will leave a message.  We will attempt to reach you two times.  During this call, we will ask if you have developed any symptoms of COVID 19. If you develop any symptoms (ie: fever, flu-like symptoms, shortness of breath, cough etc.) before then, please call [phone].  If you test positive for Covid 19 in the 2 weeks post procedure, please call and report this information to us.    If any biopsies were taken you will be contacted by phone or by letter within the next 1-3 weeks.  Please call us at [phone] if you have not heard about the biopsies in 3 weeks.    SIGNATURES/CONFIDENTIALITY: You and/or your care partner have signed paperwork which will be entered into your electronic medical record.  These signatures attest to the fact that that the information above on your After Visit Summary has been reviewed and is understood.  Full responsibility of the confidentiality of this discharge information lies with you and/or your   care-partner. 

## 2021-03-08 NOTE — Progress Notes (Signed)
History and Physical Interval Note: Patient seen 03/03/21, see that note for full details of her case. Surveillance colonoscopy to survey ileal polypoid lesion. No interval changes. Patient wishes to proceed.  03/08/2021 2:57 PM  Lynn Thompson  has presented today for endoscopic procedure(s), with the diagnosis of  Encounter Diagnoses  Name Primary?   Small bowel polyp Yes   Personal history of colonic polyps   .  The various methods of evaluation and treatment have been discussed with the patient and/or family. After consideration of risks, benefits and other options for treatment, the patient has consented to  the endoscopic procedure(s).   The patient's history has been reviewed, patient examined, no change in status, stable for surgery.  I have reviewed the patient's chart and labs.  Questions were answered to the patient's satisfaction.    Jolly Mango, MD [facility]

## 2021-03-08 NOTE — Progress Notes (Signed)
Report given to PACU, vss 

## 2021-03-10 ENCOUNTER — Telehealth: Payer: Self-pay

## 2021-03-10 NOTE — Telephone Encounter (Signed)
Left message on answering machine. 

## 2021-04-12 ENCOUNTER — Other Ambulatory Visit: Payer: Medicare Other

## 2021-04-12 ENCOUNTER — Encounter: Payer: Self-pay | Admitting: Gastroenterology

## 2021-04-12 ENCOUNTER — Ambulatory Visit (INDEPENDENT_AMBULATORY_CARE_PROVIDER_SITE_OTHER): Payer: Medicare Other | Admitting: Gastroenterology

## 2021-04-12 VITALS — BP 120/70 | HR 88 | Ht 65.25 in | Wt 149.2 lb

## 2021-04-12 DIAGNOSIS — K529 Noninfective gastroenteritis and colitis, unspecified: Secondary | ICD-10-CM

## 2021-04-12 MED ORDER — LOPERAMIDE HCL 2 MG PO TABS: 2.0000 mg | ORAL_TABLET | ORAL | 0 refills | Status: DC | PRN

## 2021-04-12 NOTE — Progress Notes (Signed)
HPI :  70 year old female here for follow-up visit for altered bowel habits. Recall patient has been followed for a liver lesion and small bowel polypoid lesion.  See prior clinic note for details of those issues from October 5  She is here for a different issue today, her bowel habits.  At her last visit she has endorsed starting to have some loose stools this past July.  At the time of her last clinic visit she stated it had improved and not bothering her too much any longer.  She underwent surveillance colonoscopy with me on October 10.  Small bowel polypoid lesion was stable in size compared to what has been seen in the past.  She had no overt inflammation of her colon.  Biopsies were not taken as I had thought her symptoms were improved at that time.  She states since the procedure her loose stools have recurred.  She states overall is been intermittent.  She will have weeks where she has bothered and then will have some times where she does not have much problems at all.  She had try to follow this over the summer in regards to her diet to see if she had any clear food triggers, at 1 point time she thought potentially she had some gluten sensitivity although she is not sure that still the case.  She has a hard time finding a clear food trigger to her symptoms.  She will have on average about 3 bowel movements per day.  Often in the morning she can have 1 large loose stool and then it is normal the rest of the day.  Sometimes form is soft and she does not have any diarrhea.  Occasional abdominal cramping and some bloating but nothing consistent.  She has had occasional urgency but nothing significant, no accidents.  She had labs done this past August showing normal red and white blood cell count.  Normal TSH level.  She has had no new changes to her medication regimen.  She has been taking some over-the-counter supplements which is not new to her, has been on them for a long time to include  chondroitin, black current oil, "focus factor", vitamin D, vitamin C, coenzyme Q 10.  She does not take any magnesium supplements.  She does not drink a lot of dairy and does not think that is related.  She still has her gallbladder in place.  She denies any grease or fat in the stool.  She wants to know specifically what has caused this.  She has had multiple abdominal imaging studies with MRI over the years in regards to her liver and small bowel lesion as below.  Prior workup: MRE done 05/02/2018 - suspected ileal polyp noted, incidentally noted septated lesion within the liver, 4.8cm x 4.3cm   MRI liver 05/16/18 - irregular multicystic lesion of segment IV of the left liver with numerous septations, concerning for cystadenoma per radiology.    Colonoscopy 09/28/2017 -  10-39mm polyp at the IC valve, 56mm cecal polyp - bite on bite biopsies negative, sessile serrated polyp   Colonoscopy 11/19/18 - The perianal and digital rectal examinations were normal. - The terminal ileum contained one sessile polyp. The polyp was roughly 10 mm in diameter or so, similar in size to exam 1 year ago, located within a few cm of the IC valve. Overlying mucosa appeared inflamed / erythematous. Multiple bite on bite biopsies were taken with a cold forceps for histology. - A 3 mm polyp  was found in the ascending colon. The polyp was flat. The polyp was removed with a cold snare. Resection and retrieval were complete. - A 3 mm polyp was found in the transverse colon. The polyp was sessile. The polyp was removed with a cold snare. Resection and retrieval were complete. - Multiple medium-mouthed diverticula were found in the sigmoid colon with associated luminal narrowing. - The exam was otherwise without abnormality.   1. Surgical [P], transverse, ascending, polyp (2) - TUBULAR ADENOMA(S). - SESSILE SERRATED POLYP WITHOUT CYTOLOGIC DYSPLASIA. - HIGH GRADE DYSPLASIA IS NOT IDENTIFIED. 2. Surgical [P], small bowel,  polyp - SUPERFICIAL FRAGMENTS OF INFLAMED POLYPOID SMALL BOWEL-TYPE MUCOSA. - THERE IS NO EVIDENCE OF CARCINOID TUMOR OR MALIGNANCY.   NM PET Dotatate SCAN - 01/16/19 - IMPRESSION: 1. No focal activity in the ileocecal region to suggest carcinoid tumor. 2. No evidence of metastatic neuroendocrine tumor. 3. No activity in cystic hepatic lesion.     MRI liver 12/03/18 - IMPRESSION: Lobulated septated cystic lesion in the anterior segment 4 left liver lobe is mildly decreased in size since 05/16/2018 MRI, and demonstrates no aggressive features, favored to be benign. The "cluster of grapes"appearance is not typical of a biliary cystadenoma. A hydatid cyst is a possibility. Continued MRI surveillance suggested in 1 year.     MRI liver 02/22/21: IMPRESSION: 3.1 cm multiloculated cystic lesion in segment 4 of the left hepatic lobe remains stable since most recent exam, and shows mild decrease in size since original study in 2019. This suggests a benign etiology, such as a biliary cystadenoma (mucinous cystic neoplasm). Due to risk of malignant transformation, surgical consultation is suggested.   MRI liver 01/29/20: IMPRESSION: 1. Clustered subcapsular cystic lesion in segment 4 of the liver is stable from 12/03/2018 but mildly reduced in size compared to the original exam from 05/01/2018. 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 Conceivably this could be a postinflammatory cystic lesion such as hydatid cyst. This would be unusual for a biliary cystadenoma. Overall given the lack of accentuated activity on PET-CT and the mild reduction in size over the last 2 years, a benign process is favored. Consider surveillance imaging by ultrasound or other cross-sectional imaging modality in 12 months time. 2. Small bilateral renal cysts. 3. Lower lumbar spondylosis, degenerative disc disease, and mild scoliosis.    Colonoscopy 03/08/21 - The perianal and digital rectal examinations were normal. - The terminal ileum contained one sessile subepithelial polypoid lesion. The lesion was roughly 10 mm in diameter, no significant changes since it was last evaluated. Bite on bite biopsies were taken with a cold forceps for histology. - A diminutive polyp was found in the ascending colon. The polyp was sessile. The polyp was removed with a cold biopsy forceps. Resection and retrieval were complete. - A few small-mouthed diverticula were found in the sigmoid colon. - The exam was otherwise without abnormality.  Diagnosis 1. Surgical [P], small bowel - SMALL INTESTINAL MUCOSA WITH REACTIVE CHANGES - NO EVIDENCE OF A NEUROENDOCRINE TUMOR - NO SUBMUCOSA IS PRESENT FOR EVALUATION 2. 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   MRI   Diverticulosis of sigmoid colon 08/2016   History of colon polyps    Hyperlipemia    Hypertension  Ileal polyp      Past Surgical History:  Procedure Laterality Date   ABDOMINAL HYSTERECTOMY     COLONOSCOPY     fibroidectomy     POLYPECTOMY     WISDOM TOOTH EXTRACTION     Family History  Problem Relation Age of Onset   Diabetes Mother    Diabetes Father    High blood pressure Father    High Cholesterol Father    Diabetes Brother    Colon cancer Neg Hx    Esophageal cancer Neg Hx    Stomach cancer Neg Hx    Colon polyps Neg Hx    Rectal cancer Neg Hx    Social History   Tobacco Use   Smoking status: Never   Smokeless tobacco: Never  Vaping Use   Vaping Use: Never used  Substance Use Topics   Alcohol use: Yes    Alcohol/week: 4.0 standard drinks    Types: 4 Glasses of wine per week   Drug use: No   Current Outpatient Medications  Medication Sig Dispense Refill    famciclovir (FAMVIR) 500 MG tablet Take by mouth daily as needed.      irbesartan (AVAPRO) 75 MG tablet Take 75 mg by mouth at bedtime.     simvastatin (ZOCOR) 40 MG tablet Take 20 mg by mouth daily.     spironolactone (ALDACTONE) 25 MG tablet Take 25 mg by mouth daily.     No current facility-administered medications for this visit.   Allergies  Allergen Reactions   Codeine Nausea And Vomiting   Sulfa Antibiotics     Unknown    Latex Rash   Penicillins Rash    Severe "inside and out"     Review of Systems: All systems reviewed and negative except where noted in HPI.    Lab Results  Component Value Date   WBC 8.2 01/21/2020   HGB 14.7 01/21/2020   HCT 42.7 01/21/2020   MCV 91.4 01/21/2020   PLT 195.0 01/21/2020    Labs in careeverywhere  Physical Exam: BP 120/70 (BP Location: Left Arm, Patient Position: Sitting, Cuff Size: Normal)   Pulse 88 Comment: irregular  Ht 5' 5.25" (1.657 m) Comment: height measured without shoes  Wt 149 lb 4 oz (67.7 kg)   BMI 24.65 kg/m  Constitutional: Pleasant,well-developed, female in no acute distress. Neurological: Alert and oriented to person place and time. Psychiatric: Normal mood and affect. Behavior is normal.   ASSESSMENT AND PLAN: 70 year old female here for reassessment following:  Chronic diarrhea / Altered bowel habits  As above, ongoing intermittent symptoms of loose stool since this past summer.  Her colonoscopy to assess the ileal lesion looked good, no overt inflammation in her colon or bowel otherwise.  At the time of that exam her symptoms weren't bothering her and thus no random biopsies taken, however symptoms have since recurred intermittently.  That being said her course is not typical for microscopic colitis.  She had no overt colitis, and infection unlikely given chronicity of this.  We discussed she could have a functional bowel change, I do not see any obvious medicines or supplements that she takes that  would otherwise cause this.  She questions possible gluten sensitivity, I will screen her for celiac disease.  We reviewed dietary measures that can help with this, counseled on a low FODMAP diet.  Imodium does help if she takes it, she can use this as needed moving forward depending on how much this bothers her.  We  discussed other options such as Colestid/cholestyramine versus others.  She wishes to hold off on other regimens right now, I provided some reassurance to her as we discussed underlying etiology, I do not think we are missing anything concerning. Ileal lesions is benign appearing, negative DOTATATE PET.  She will see how things go and message me if she wishes to try other options moving forward if symptoms persist.  She agreed  Jolly Mango, MD [facility]

## 2021-04-12 NOTE — Patient Instructions (Signed)
If you are age 70 or older, your body mass index should be between 23-30. Your Body mass index is 24.65 kg/m. If this is out of the aforementioned range listed, please consider follow up with your Primary Care Provider.  If you are age 73 or younger, your body mass index should be between 19-25. Your Body mass index is 24.65 kg/m. If this is out of the aformentioned range listed, please consider follow up with your Primary Care Provider.   ________________________________________________________  The [facility] providers would like to encourage you to use [facility] to communicate with providers for non-urgent requests or questions.  Due to long hold times on the telephone, sending your provider a message by [facility] may be a faster and more efficient way to get a response.  Please allow 48 business hours for a response.  Please remember that this is for non-urgent requests.  _______________________________________________________   Please go to the lab in the basement of our building to have lab work done as you leave today. Hit "B" for basement when you get on the elevator.  When the doors open the lab is on your left.  We will call you with the results. Thank you.  We are giving you a Low-FODMAP diet handout today. FODMAPs are short-chain carbohydrates (sugars) that are highly fermentable, which means that they go through chemical changes in the GI system, and are poorly absorbed during digestion. When FODMAPs reach the colon (large intestine), bacteria ferment these sugars, turning them into gas and chemicals. This stretches the walls of the colon, causing abdominal bloating, distension, cramping, pain, and/or changes in bowel habits in many patients with IBS. FODMAPs are not unhealthy or harmful, but may exacerbate GI symptoms in those with sensitive GI tracts.  You can use Imodium as needed  Thank you for entrusting me with your care and for choosing [facility], Dr. Ransom Canyon Cellar

## 2021-04-13 LAB — IGA: Immunoglobulin A: 217 mg/dL (ref 70–320)

## 2021-04-13 LAB — TISSUE TRANSGLUTAMINASE, IGA: (tTG) Ab, IgA: <1 U/mL

## 2021-09-23 ENCOUNTER — Other Ambulatory Visit: Payer: Self-pay | Admitting: Physician Assistant

## 2021-09-23 DIAGNOSIS — Z1231 Encounter for screening mammogram for malignant neoplasm of breast: Secondary | ICD-10-CM

## 2021-11-10 ENCOUNTER — Ambulatory Visit
Admission: RE | Admit: 2021-11-10 | Discharge: 2021-11-10 | Disposition: A | Discharge status: Home / Self Care | Payer: Medicare Other | Source: Ambulatory Visit | Attending: Physician Assistant | Admitting: Physician Assistant

## 2021-11-10 DIAGNOSIS — Z1231 Encounter for screening mammogram for malignant neoplasm of breast: Secondary | ICD-10-CM

## 2021-11-12 ENCOUNTER — Other Ambulatory Visit: Payer: Self-pay | Admitting: Physician Assistant

## 2021-11-12 DIAGNOSIS — R928 Other abnormal and inconclusive findings on diagnostic imaging of breast: Secondary | ICD-10-CM

## 2021-11-16 ENCOUNTER — Ambulatory Visit
Admission: RE | Admit: 2021-11-16 | Discharge: 2021-11-16 | Disposition: A | Discharge status: Home / Self Care | Payer: Medicare Other | Source: Ambulatory Visit | Attending: Physician Assistant | Admitting: Physician Assistant

## 2021-11-16 ENCOUNTER — Ambulatory Visit: Payer: Medicare Other

## 2021-11-16 DIAGNOSIS — R928 Other abnormal and inconclusive findings on diagnostic imaging of breast: Secondary | ICD-10-CM

## 2022-02-26 ENCOUNTER — Other Ambulatory Visit: Payer: Self-pay

## 2022-02-26 ENCOUNTER — Encounter (HOSPITAL_COMMUNITY): Payer: Self-pay

## 2022-02-26 ENCOUNTER — Emergency Department (HOSPITAL_COMMUNITY): Payer: Medicare Other

## 2022-02-26 ENCOUNTER — Emergency Department (HOSPITAL_COMMUNITY)
Admission: EM | Admit: 2022-02-26 | Discharge: 2022-02-26 | Disposition: A | Discharge status: Home / Self Care | Payer: Medicare Other | Attending: Emergency Medicine | Admitting: Emergency Medicine

## 2022-02-26 DIAGNOSIS — I10 Essential (primary) hypertension: Secondary | ICD-10-CM | POA: Insufficient documentation

## 2022-02-26 DIAGNOSIS — Z7982 Long term (current) use of aspirin: Secondary | ICD-10-CM | POA: Diagnosis not present

## 2022-02-26 DIAGNOSIS — R1031 Right lower quadrant pain: Secondary | ICD-10-CM | POA: Diagnosis present

## 2022-02-26 DIAGNOSIS — Z79899 Other long term (current) drug therapy: Secondary | ICD-10-CM | POA: Insufficient documentation

## 2022-02-26 DIAGNOSIS — Z91040 Latex allergy status: Secondary | ICD-10-CM | POA: Insufficient documentation

## 2022-02-26 LAB — CBC
HCT: 42.8 % (ref 36.0–46.0)
Hemoglobin: 14.5 g/dL (ref 12.0–15.0)
MCH: 32 pg (ref 26.0–34.0)
MCHC: 33.9 g/dL (ref 30.0–36.0)
MCV: 94.5 fL (ref 80.0–100.0)
Platelets: 186 10*3/uL (ref 150–400)
RBC: 4.53 MIL/uL (ref 3.87–5.11)
RDW: 11.9 % (ref 11.5–15.5)
WBC: 6.7 10*3/uL (ref 4.0–10.5)
nRBC: 0 % (ref 0.0–0.2)

## 2022-02-26 LAB — COMPREHENSIVE METABOLIC PANEL
ALT: 21 U/L (ref 0–44)
AST: 25 U/L (ref 15–41)
Albumin: 4.1 g/dL (ref 3.5–5.0)
Alkaline Phosphatase: 56 U/L (ref 38–126)
Anion gap: 6 (ref 5–15)
BUN: 22 mg/dL (ref 8–23)
CO2: 28 mmol/L (ref 22–32)
Calcium: 9.1 mg/dL (ref 8.9–10.3)
Chloride: 108 mmol/L (ref 98–111)
Creatinine, Ser: 0.68 mg/dL (ref 0.44–1.00)
GFR, Estimated: >60 mL/min (ref >60)
Glucose, Bld: 92 mg/dL (ref 70–99)
Potassium: 4.1 mmol/L (ref 3.5–5.1)
Sodium: 142 mmol/L (ref 135–145)
Total Bilirubin: 0.5 mg/dL (ref 0.3–1.2)
Total Protein: 7.1 g/dL (ref 6.5–8.1)

## 2022-02-26 LAB — URINALYSIS, ROUTINE W REFLEX MICROSCOPIC
Bilirubin Urine: NEGATIVE
Glucose, UA: NEGATIVE mg/dL
Hgb urine dipstick: NEGATIVE
Ketones, ur: NEGATIVE mg/dL
Leukocytes,Ua: NEGATIVE
Nitrite: NEGATIVE
Protein, ur: NEGATIVE mg/dL
Specific Gravity, Urine: 1.009 (ref 1.005–1.030)
pH: 6 (ref 5.0–8.0)

## 2022-02-26 LAB — LIPASE, BLOOD: Lipase: 36 U/L (ref 11–51)

## 2022-02-26 MED ORDER — IOHEXOL 300 MG/ML  SOLN
100.0000 mL | Freq: Once | INTRAMUSCULAR | Status: AC | PRN
  Administered 2022-02-26: 100 mL via INTRAVENOUS

## 2022-02-26 NOTE — ED Triage Notes (Addendum)
Patient c/o intermittent RLQ abdominal pain. Patient also reports that the pain radiates in to the right groin at times.Patient states pain has worsened today Patient denies any N/v/D.

## 2022-02-26 NOTE — ED Provider Notes (Signed)
[facility] DEPT Provider Note   CSN: [id number] Arrival date & time: 02/26/22  1014     History  Chief Complaint  Patient presents with   Abdominal Pain    Lynn Thompson is a 71 y.o. female with medical history of biliary cysts, diverticulosis of the colon, colon polyps, hypertension.  The patient presents to the ED for evaluation of right lower quadrant pain.  Patient reports that for the last 1 week she has had intermittent and sporadic right lower quadrant pain that she describes as sharp.  The patient denies that this pain is constant, states that it comes and goes.  The patient is unsure of what makes this pain worse, she denies any alleviating or aggravating factors.  Patient reports remote history of hysterectomy over 10 years ago, she is unsure if this is scar tissue.  Patient reports that this pain was not concerning her however this morning she leaned against her kitchen counter which caused the pain to be acutely worsened causing her to present to ED for evaluation.  Patient denies any fevers, nausea, vomiting, diarrhea, decreased appetite, dysuria, flank pain.   Abdominal Pain Associated symptoms: no diarrhea, no dysuria, no fever, no nausea and no vomiting        Home Medications Prior to Admission medications   Medication Sig Start Date End Date Taking? Authorizing Provider  acetaminophen (TYLENOL) 500 MG tablet Take 500 mg by mouth at bedtime.   Yes [provider]  Ascorbic Acid (VITAMIN C) 1000 MG tablet Take 1,000 mg by mouth 2 (two) times daily.   Yes [provider]  aspirin EC 81 MG tablet Take 81 mg by mouth daily. Swallow whole.   Yes [provider]  Calcium Carbonate (CALCIUM 600 PO) Take 600 mg by mouth daily.   Yes [provider]  cholecalciferol (VITAMIN D3) 25 MCG (1000 UNIT) tablet Take 1,000 Units by mouth daily.   Yes [provider]  co-enzyme Q-10 30 MG capsule Take 30 mg by  mouth daily.   Yes [provider]  famciclovir (FAMVIR) 500 MG tablet Take 500 mg by mouth daily as needed (viral outbreak).   Yes [provider]  ibuprofen (ADVIL) 200 MG tablet Take 400 mg by mouth every 6 (six) hours as needed for mild pain.   Yes [provider]  irbesartan (AVAPRO) 75 MG tablet Take 75 mg by mouth at bedtime.   Yes [provider]  Misc Natural Products (TOTAL MEMORY & FOCUS FORMULA) TABS Take 1 tablet by mouth daily.   Yes [provider]  OVER THE COUNTER MEDICATION Take 1 tablet by mouth daily. Black current seed  oil   Yes [provider]  simvastatin (ZOCOR) 40 MG tablet Take 20 mg by mouth daily.   Yes [provider]  spironolactone (ALDACTONE) 25 MG tablet Take 25 mg by mouth daily.   Yes [provider]      Allergies    Codeine, Sulfa antibiotics, Latex, and Penicillins    Review of Systems   Review of Systems  Constitutional:  Negative for appetite change and fever.  Gastrointestinal:  Positive for abdominal pain. Negative for diarrhea, nausea and vomiting.  Genitourinary:  Negative for dysuria and flank pain.  All other systems reviewed and are negative.   Physical Exam Updated Vital Signs BP (!) 154/90   Pulse 78   Temp 98 F (36.7 C) (Oral)   Resp 16   Ht '5\' 6"'$  (  1.676 m)   Wt 66.2 kg   SpO2 99%   BMI 23.57 kg/m  Physical Exam Vitals and nursing note reviewed.  Constitutional:      General: She is not in acute distress.    Appearance: She is well-developed. She is not ill-appearing, toxic-appearing or diaphoretic.  HENT:     Head: Normocephalic and atraumatic.     Mouth/Throat:     Mouth: Mucous membranes are moist.  Eyes:     Extraocular Movements: Extraocular movements intact.     Conjunctiva/sclera: Conjunctivae normal.     Pupils: Pupils are equal, round, and reactive to light.  Cardiovascular:     Rate and Rhythm: Normal rate and regular rhythm.  Pulmonary:      Effort: Pulmonary effort is normal.     Breath sounds: Normal breath sounds. No wheezing.  Abdominal:     General: Abdomen is flat. Bowel sounds are normal.     Tenderness: There is abdominal tenderness in the right lower quadrant.  Musculoskeletal:     Cervical back: Normal range of motion and neck supple. No tenderness.  Skin:    General: Skin is warm and dry.     Capillary Refill: Capillary refill takes less than 2 seconds.  Neurological:     Mental Status: She is alert.     ED Results / Procedures / Treatments   Labs (all labs ordered are listed, but only abnormal results are displayed) Labs Reviewed  LIPASE, BLOOD  COMPREHENSIVE METABOLIC PANEL  CBC  URINALYSIS, ROUTINE W REFLEX MICROSCOPIC    EKG None  Radiology CT ABDOMEN PELVIS W CONTRAST  Result Date: 02/26/2022 CLINICAL DATA:  Right lower quadrant abdominal pain. EXAM: CT ABDOMEN AND PELVIS WITH CONTRAST TECHNIQUE: Multidetector CT imaging of the abdomen and pelvis was performed using the standard protocol following bolus administration of intravenous contrast. RADIATION DOSE REDUCTION: This exam was performed according to the departmental dose-optimization program which includes automated exposure control, adjustment of the mA and/or kV according to patient size and/or use of iterative reconstruction technique. CONTRAST:  122m OMNIPAQUE IOHEXOL 300 MG/ML  SOLN COMPARISON:  MR abdomen 02/22/2021 and earlier FINDINGS: Lower chest: No acute abnormality. Hepatobiliary: A 3.3 cm multiloculated cystic lesion in segment IV is stable compared to 02/22/2021. Two other subcentimeter oval circumscribed hypodense lesions in the superior left hepatic lobe and inferior right hepatic lobe are also stable and correspond to benign hepatic cysts on prior MR. No new focal hepatic lesion. No gallstones or gallbladder wall thickening. No bile duct dilatation. Pancreas: Unremarkable. No pancreatic ductal dilatation or surrounding  inflammatory changes. Spleen: Normal in size without focal abnormality. Adrenals/Urinary Tract: Adrenal glands are unremarkable. Kidneys are normal, without renal calculi, suspicious focal lesion, or hydronephrosis. Bladder is unremarkable. Stomach/Bowel: Stomach is within normal limits. Stool distends the proximal appendix while the mid to distal appendix is normal caliber (series 2, images 58-59). No periappendiceal fat stranding or other inflammatory changes. Diverticulosis of the sigmoid colon. No evidence of bowel wall thickening, distention, or inflammatory changes. Vascular/Lymphatic: Mild scattered aortic atherosclerosis. No enlarged abdominal or pelvic lymph nodes. Reproductive: Status post hysterectomy. No adnexal masses. Other: Small fat containing umbilical hernia. No abdominopelvic ascites. Musculoskeletal: No acute or suspicious osseous findings. Mild leftward curvature of the lumbar spine. Severe degenerative disc disease at L3-L4 and L4-L5 with vacuum disc phenomenon. IMPRESSION: 1. No acute abnormality in the abdomen or pelvis. 2. Stool distends the proximal appendix while the mid to distal appendix is normal caliber. No periappendiceal fat  stranding or other inflammatory changes to suggest acute appendicitis. 3. Sigmoid diverticulosis without findings of diverticulitis. 4. Stable multiloculated cystic lesion in hepatic segment IV. Electronically Signed   By: Ileana Roup M.D.   On: 02/26/2022 13:37    Procedures Procedures   Medications Ordered in ED Medications  iohexol (OMNIPAQUE) 300 MG/ML solution 100 mL (100 mLs Intravenous Contrast Given 02/26/22 1255)    ED Course/ Medical Decision Making/ A&P                           Medical Decision Making Amount and/or Complexity of Data Reviewed Labs: ordered. Radiology: ordered.   71 year old female presents to the ED for evaluation.  Please see HPI for further details.  On examination patient afebrile and nontachycardic.  The  patient lung sounds are clear bilaterally, she is not hypoxic.  The patient abdomen has tenderness in the right lower quadrant without overlying skin change.  Due to right lower quadrant tenderness we will proceed with CT scan.  In triage, the patient had labs ordered to include CMP, lipase, CBC, urinalysis.  Patient CBC is unremarkable, there is no leukocytosis.  The patient urinalysis is unremarkable.  Patient lipase is 36, unremarkable.  Patient CMP is unremarkable.  Patient CT scan abdomen pelvis does show stool that distends the proximal appendix.  There is no periappendiceal fat stranding or acute inflammation, there is no signs of an acute appendicitis.  Due to findings of stool and appendix, general surgery was consulted. Dr. Ninfa Linden of general surgery states that this patient will need GI follow-up.  The patient reports that she has established care with Dr. Havery Moros of GI.  Patient be discharged home and advised to return to the ED with any new symptoms such as increased right lower quadrant pain, fevers, decreased appetite.  The patient voices understanding of instructions.  The patient was advised to follow-up with her GI doctor, Dr. Havery Moros.  The patient again voiced understanding of instructions.  The patient however questions answered her satisfaction prior to discharge.  The patient stable at this time for discharge home.  Final Clinical Impression(s) / ED Diagnoses Final diagnoses:  Right lower quadrant abdominal pain    Rx / DC Orders ED Discharge Orders     None         Azucena Cecil, PA-C 02/26/22 1441    Godfrey Pick, MD 02/27/22 1308

## 2022-02-26 NOTE — Discharge Instructions (Signed)
Please return to the ED with new symptoms such as fevers, increased right lower quadrant pain, decreased appetite Please follow-up with your GI doctor, Dr. Havery Moros Please read the attached guide concerning abdominal pain

## 2022-02-28 ENCOUNTER — Telehealth: Payer: Self-pay | Admitting: Gastroenterology

## 2022-02-28 MED ORDER — DICYCLOMINE HCL 10 MG PO CAPS: 10.0000 mg | ORAL_CAPSULE | Freq: Three times a day (TID) | ORAL | 1 refills | Status: DC | PRN

## 2022-02-28 NOTE — Telephone Encounter (Signed)
Called and spoke with patient regarding Dr. Doyne Keel recommendations as outlined below. Pt would like RX for Bentyl sent to pharmacy on file. There was a cancellation for an appt with an APP this week. Pt's appt has been rescheduled to Wednesday, 03/02/22 at 10:45 am with Tye Savoy, NP. Pt is aware that she will need ED evaluation if she has worsening pain, develops a fever, etc. Pt verbalized understanding and had no concerns at the end of the call.

## 2022-02-28 NOTE — Telephone Encounter (Signed)
Chart reviewed. Looks like she was in the ED for abdominal pain. Her CT shows some stool in the proximal appendix but rest of appendix looks okay. Her blood work was normal. There is no evidence of appendicitis on her CT scan. Surgery saw her and did not think her appendix needed to be removed. Based on this workup this appendix finding is likely not causing problems and may be a red herring, however if she is having worsening pain, fevers, etc, then she will need further evaluation more urgently. Unclear what is causing her pain without evaluating her. We can try some Bentyl '10mg'$  every 8 hours to see if that will help. Any openings come up in the office with me or APP this week? If she has severe pain, fevers, intolerance of PO in the interim, etc, she will need to seek re-evaluation in the ED

## 2022-02-28 NOTE — Telephone Encounter (Signed)
Returned call to patient. She states that she was recently seen in the ED. She reports that after the CT scan she was told that she has "poop in her appendix". Pt is concerned because she was told if this is not taken care of it can lead to something more serious. Patient has been scheduled for next available appt with an APP on Tuesday, 03/29/22 at 2:15 pm with Alonza Bogus, PA-C. Patient is concerned about waiting that long and wants to know if there is something that she can be doing in the interim. Pt states that her stools are not hard. She states that the pain doubles her over at times. ED did not prescribe any meds. Pt is aware that I am sending you a message but you are out of the office until tomorrow. Pt knows that I will call her back with your recommendations. Please advise, thanks.

## 2022-02-28 NOTE — Telephone Encounter (Signed)
Patient called states she was just seen at the ED on Saturday with an appendix issue requesting an appointment to follow up, after providing her with the providers next available date she states that was unacceptable and requested to speak with a nurse.

## 2022-03-02 ENCOUNTER — Ambulatory Visit (INDEPENDENT_AMBULATORY_CARE_PROVIDER_SITE_OTHER): Payer: Medicare Other | Admitting: Nurse Practitioner

## 2022-03-02 ENCOUNTER — Encounter: Payer: Self-pay | Admitting: Nurse Practitioner

## 2022-03-02 VITALS — BP 145/82 | HR 80 | Ht 66.0 in | Wt 145.4 lb

## 2022-03-02 DIAGNOSIS — R1031 Right lower quadrant pain: Secondary | ICD-10-CM | POA: Diagnosis not present

## 2022-03-02 NOTE — Patient Instructions (Signed)
If you are age 71 or older, your body mass index should be between 23-30. Your Body mass index is 23.47 kg/m. If this is out of the aforementioned range listed, please consider follow up with your Primary Care Provider.  If you are age 69 or younger, your body mass index should be between 19-25. Your Body mass index is 23.47 kg/m. If this is out of the aformentioned range listed, please consider follow up with your Primary Care Provider.   ________________________________________________________  The [facility] providers would like to encourage you to use [facility] to communicate with providers for non-urgent requests or questions.  Due to long hold times on the telephone, sending your provider a message by [facility] may be a faster and more efficient way to get a response.  Please allow 48 business hours for a response.  Please remember that this is for non-urgent requests.  _______________________________________________________   We will contact you once we speak with Dr. Havery Moros.   It was a pleasure to see you today!  Thank you for trusting me with your gastrointestinal care!

## 2022-03-02 NOTE — Progress Notes (Signed)
Chief Complaint:  RLQ pain, wants to discuss her CT scan   Assessment &  Plan   # 71 yo female with intermittent RLQ pain. Has had minor 'twinges' of pinching RLQ through the years but last Saturday pain became severe prompting ED visit. Labs unremarkable. CTAP with contrast --> stool distending the proximal appendix with normal caliber of mid to distal appendix.  Nothing to suggest acute appendicitis. Etiology of pain is unclear. It isn't related to eating or defecation. Movement doesn't provoke the pain. Adhesive disease? Bowel spasm?  ED spoke with General Surgery while patient was in ED . GI evaluation was recommended.  She has tried to Bentyl  we prescribed after her ED visit. I encouraged her to at least try a dose when / if pain recurs.  She is very concerned that severe pain may reoccur and wants more answers about appendix / CT scan findings. I don't know which / if further imaging will be beneficial, will discuss with Dr. Havery Moros. Negative psoas sign.   HPI   Lynn Thompson is a 71 y.o. female known to Dr.  Havery Moros who has followed by for chronic diarrhea, liver lesion, small bowel polypoid lesion.  See PMH /PSH for additional history   Patient was last seen 04/12/22 for recurrent loose stool.  See that office note for details.    ED 02/26/22 Patient was in the ED on 02/26/2022 with complaints of RLQ pain.  She was afebrile.  CBC, CMET , U/A unremarkable.  CTAP with contrast --> stool distending the proximal appendix with normal caliber of mid to distal appendix.  Nothing to suggest acute appendicitis.  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  General surgery evaluated. No need for surgical intervention, GI follow-up was recommended.    Patient subsequently called the office for an appointment .  In the interim she wanted advice, was scared there was something serious going on.  Dr. Havery Moros reviewed her CT  scan and did not feel appendix was causing her pain.  She was given a trial of Bentyl and an appt  Interval history:  She had another episode of RLQ pain on Sunday, the day after her ED visit. Since Sunday she has had only minor twinges of pinching type pain in RLQ.  She hasn't tried the Bentyl.  She has had these "twinges" of pain off and on through the years but the acute escalation of pain on Saturday is what prompted the ED visit. Triggers for the pain are unknown. It doesn't seem positional though when having an episode of pain is hurts to move about. The pain is not relieved with defecation. She has soft / mushy BMs several times a day. No urinary or vaginal symptoms   Also having some minor, intermittent chest pain and dyspnea on exertion. Chest feels "heavy" . Not having heartburn. No history of GERD .  Previous GI Evaluation  Most recent colonoscopy  02/2021 - One polypoid lesion in the terminal ileum as outlined above, no significant interval changes.Biopsied. - One diminutive polyp in the ascending colon, removed with a cold biopsy forceps. Resected and retrieved. - Diverticulosis in the sigmoid colon. - The examination was otherwise normal.  Surgical [P], small bowel - SMALL INTESTINAL MUCOSA WITH REACTIVE CHANGES - NO EVIDENCE OF A NEUROENDOCRINE TUMOR - NO SUBMUCOSA IS PRESENT FOR EVALUATION 2. Surgical [P], colon, ascending, polyp (1) - POLYPOID COLONIC MUCOSA WITH A PROMINENT LYMPHOID AGGREGATE - NEGATIVE  FOR DYSPLASIA - MULTIPLE STEP SECTIONS WERE EXAMINED   Imaging   02/26/22 CTAP with contrast IMPRESSION: 1. No acute abnormality in the abdomen or pelvis. 2. Stool distends the proximal appendix while the mid to distal appendix is normal caliber. No periappendiceal fat stranding or other inflammatory changes to suggest acute appendicitis. 3. Sigmoid diverticulosis without findings of diverticulitis. 4. Stable multiloculated cystic lesion in hepatic segment  IV.  Labs:     Latest Ref Rng & Units 02/26/2022   10:50 AM 01/21/2020    4:20 PM  CBC  WBC 4.0 - 10.5 K/uL 6.7  8.2   Hemoglobin 12.0 - 15.0 g/dL 14.5  14.7   Hematocrit 36.0 - 46.0 % 42.8  42.7   Platelets 150 - 400 K/uL 186  195.0        Latest Ref Rng & Units 02/26/2022   10:50 AM 01/21/2020    4:20 PM  Hepatic Function  Total Protein 6.5 - 8.1 g/dL 7.1  7.0   Albumin 3.5 - 5.0 g/dL 4.1  4.3   AST 15 - 41 U/L 25  21   ALT 0 - 44 U/L 21  19   Alk Phosphatase 38 - 126 U/L 56  62   Total Bilirubin 0.3 - 1.2 mg/dL 0.5  0.4   Bilirubin, Direct 0.0 - 0.3 mg/dL  0.0      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     Current Outpatient Medications  Medication Sig Dispense Refill   acetaminophen (TYLENOL) 500 MG tablet Take 500 mg by mouth at bedtime.     Ascorbic Acid (VITAMIN C) 1000 MG tablet Take 1,000 mg by mouth 2 (two) times daily.     aspirin EC 81 MG tablet Take 81 mg by mouth daily. Swallow whole.     Calcium Carbonate (CALCIUM 600 PO) Take 600 mg by mouth daily.     cholecalciferol (VITAMIN D3) 25 MCG (1000 UNIT) tablet Take 1,000 Units by mouth daily.     co-enzyme Q-10 30 MG capsule Take 30 mg by mouth daily.     dicyclomine (BENTYL) 10 MG capsule Take 1 capsule (10 mg total) by mouth every 8 (eight) hours as needed (abdominal cramping or pain). 30 capsule 1   famciclovir (FAMVIR) 500 MG tablet Take 500 mg by mouth daily as needed (viral outbreak).     ibuprofen (ADVIL) 200 MG tablet Take 400 mg by mouth every 6 (six) hours as  needed for mild pain.     irbesartan (AVAPRO) 75 MG tablet Take 75 mg by mouth at bedtime.     Misc Natural Products (TOTAL MEMORY & FOCUS FORMULA) TABS Take 1 tablet by mouth daily.     OVER THE COUNTER MEDICATION Take 1 tablet by mouth daily. Black current seed  oil     simvastatin (ZOCOR) 40 MG tablet Take 20 mg by mouth daily.     spironolactone (ALDACTONE) 25 MG tablet Take 25 mg by mouth  daily.     No current facility-administered medications for this visit.    Review of Systems: No chest pain. No shortness of breath. No urinary complaints.    Physical Exam  Wt Readings from Last 3 Encounters:  02/26/22 146 lb (66.2 kg)  04/12/21 149 lb 4 oz (67.7 kg)  03/08/21 147 lb (66.7 kg)    BP (!) 145/82   Pulse 80   Ht '5\' 6"'  (1.676 m)   Wt 145 lb 6.4 oz (66 kg)   SpO2 98%   BMI 23.47 kg/m  Constitutional:  Generally well appearing female in no acute distress. Psychiatric: Pleasant. Normal mood and affect. Behavior is normal. EENT: Pupils normal.  Conjunctivae are normal. No scleral icterus. Neck supple.  Cardiovascular: Normal rate, regular rhythm. No edema Pulmonary/chest: Effort normal and breath sounds normal. No wheezing, rales or rhonchi. Abdominal: Soft, nondistended, localized tenderness in RLQ. Bowel sounds active throughout. There are no masses palpable. No hepatomegaly. Negative Psoas sign.  Neurological: Alert and oriented to person place and time. Skin: Skin is warm and dry. No rashes noted.  Tye Savoy, NP  03/02/2022, 10:39 AM

## 2022-03-03 NOTE — Progress Notes (Signed)
Agree with assessment and plan as outlined. Her CT did not show any evidence of appendicitis, surgery evaluated her and felt the same way.  She had some mild stool distending her appendix but I think unlikely causing her pain.  I agree with trying some Bentyl when she has recurrent pain next time or contact us.  I do not think we are missing anything concerning based on the CT scan.  She had a colonoscopy a year ago and a base of her appendix was normal.  If she is really concerned about this and wants another opinion from a surgeon we can refer her to have a second opinion.  Thanks.

## 2022-03-03 NOTE — Telephone Encounter (Addendum)
Received call from pt. Pt stated that she saw Nevin Bloodgood in the office yesterday and that Nevin Bloodgood was going to talk with Dr. Havery Moros. Pt wanted to know what was decided when talking with Dr. Havery Moros.  Pt requesting a call from [place]. Pt's number is [phone].

## 2022-03-04 ENCOUNTER — Encounter: Payer: Self-pay | Admitting: Gastroenterology

## 2022-03-04 NOTE — Telephone Encounter (Signed)
Received another call from pt. Gave pt Dr. Doyne Keel message on office visit. Pt verbalized understanding and wanted message sent to her mychart. Mychart message sent to pt.

## 2022-03-08 NOTE — Telephone Encounter (Signed)
Dr. Havery Moros spoke with pt on 10/6 and pt reported pain had resolved and she felt well.

## 2022-03-29 ENCOUNTER — Ambulatory Visit: Payer: Medicare Other | Admitting: Gastroenterology

## 2022-05-31 ENCOUNTER — Encounter: Payer: Self-pay | Admitting: Gastroenterology

## 2022-09-28 ENCOUNTER — Other Ambulatory Visit: Payer: Self-pay | Admitting: Physician Assistant

## 2022-09-28 DIAGNOSIS — Z1231 Encounter for screening mammogram for malignant neoplasm of breast: Secondary | ICD-10-CM

## 2022-11-14 ENCOUNTER — Ambulatory Visit
Admission: RE | Admit: 2022-11-14 | Discharge: 2022-11-14 | Disposition: A | Discharge status: Home / Self Care | Payer: Medicare Other | Source: Ambulatory Visit | Attending: Physician Assistant | Admitting: Physician Assistant

## 2022-11-14 DIAGNOSIS — Z1231 Encounter for screening mammogram for malignant neoplasm of breast: Secondary | ICD-10-CM

## 2023-10-20 ENCOUNTER — Other Ambulatory Visit: Payer: Self-pay | Admitting: Physician Assistant

## 2023-10-20 DIAGNOSIS — Z1231 Encounter for screening mammogram for malignant neoplasm of breast: Secondary | ICD-10-CM

## 2023-11-20 ENCOUNTER — Telehealth: Payer: Self-pay | Admitting: Gastroenterology

## 2023-11-20 NOTE — Telephone Encounter (Signed)
 Patient has not been seen since 2023 - she needs to schedule an office visit before we can give her a refill

## 2023-11-20 NOTE — Telephone Encounter (Signed)
 Inbound call from patient requesting a refill for dicyclomine  medication. States she has a refill she has not used and would like to Reganne the refill now due to going out of town soon. Please advise, thank you.

## 2023-11-22 ENCOUNTER — Ambulatory Visit
Admission: RE | Admit: 2023-11-22 | Discharge: 2023-11-22 | Disposition: A | Discharge status: Home / Self Care | Source: Ambulatory Visit | Attending: Physician Assistant

## 2023-11-22 DIAGNOSIS — Z1231 Encounter for screening mammogram for malignant neoplasm of breast: Secondary | ICD-10-CM

## 2023-11-22 MED ORDER — DICYCLOMINE HCL 10 MG PO CAPS: 10.0000 mg | ORAL_CAPSULE | Freq: Three times a day (TID) | ORAL | 1 refills | Status: AC | PRN

## 2023-11-22 NOTE — Telephone Encounter (Signed)
 Bentyl refilled.

## 2023-11-22 NOTE — Telephone Encounter (Signed)
 Inbound call from pt to have medication sent to her pharmacy. She has scheduled an office visit for 01/17/2024. Please send medication to pharmacy   Please advise Thank you

## 2024-01-17 ENCOUNTER — Ambulatory Visit: Admitting: Physician Assistant

## 2024-03-05 ENCOUNTER — Encounter

## 2024-03-19 ENCOUNTER — Ambulatory Visit

## 2024-03-19 VITALS — Ht 66.0 in | Wt 149.4 lb

## 2024-03-19 DIAGNOSIS — Z860100 Personal history of colon polyps, unspecified: Secondary | ICD-10-CM

## 2024-03-19 MED ORDER — NA SULFATE-K SULFATE-MG SULF 17.5-3.13-1.6 GM/177ML PO SOLN: 1.0000 | Freq: Once | ORAL | 0 refills | Status: AC

## 2024-03-19 NOTE — Progress Notes (Signed)
 No egg or soy allergy known to patient  No issues known to pt with past sedation with any surgeries or procedures Patient denies ever being told they had issues or difficulty with intubation  No FH of Malignant Hyperthermia Pt is not on diet pills Pt is not on  home 02  Pt is not on blood thinners  Pt denies issues with constipation  No A fib or A flutter Have any cardiac testing pending-- no  LOA: independent  Prep: suprep   Patient's chart reviewed by Norleen Schillings CNRA prior to previsit and patient appropriate for the [facility].  Previsit completed and red dot placed by patient's name on their procedure day (on provider's schedule).     PV completed with patient. Prep instructions sent via mychart and hard copy given at [facility] apt

## 2024-03-22 ENCOUNTER — Encounter: Admitting: Gastroenterology

## 2024-04-05 ENCOUNTER — Ambulatory Visit: Admitting: Gastroenterology

## 2024-04-05 ENCOUNTER — Encounter: Payer: Self-pay | Admitting: Gastroenterology

## 2024-04-05 VITALS — BP 105/60 | HR 77 | Temp 97.6°F | Resp 14 | Ht 66.0 in | Wt 145.0 lb

## 2024-04-05 DIAGNOSIS — Z1211 Encounter for screening for malignant neoplasm of colon: Secondary | ICD-10-CM

## 2024-04-05 DIAGNOSIS — K6389 Other specified diseases of intestine: Secondary | ICD-10-CM | POA: Diagnosis not present

## 2024-04-05 DIAGNOSIS — K5730 Diverticulosis of large intestine without perforation or abscess without bleeding: Secondary | ICD-10-CM | POA: Diagnosis not present

## 2024-04-05 DIAGNOSIS — Z860100 Personal history of colon polyps, unspecified: Secondary | ICD-10-CM | POA: Diagnosis not present

## 2024-04-05 DIAGNOSIS — D124 Benign neoplasm of descending colon: Secondary | ICD-10-CM

## 2024-04-05 MED ORDER — SODIUM CHLORIDE 0.9 % IV SOLN: 500.0000 mL | Freq: Once | INTRAVENOUS | Status: DC

## 2024-04-05 NOTE — Patient Instructions (Signed)
 Handouts provided about diverticulosis and polyps.  Resume previous diet.  Continue present medications.  Await pathology results.  YOU HAD AN ENDOSCOPIC PROCEDURE TODAY AT THE [facility]:   Refer to the procedure report that was given to you for any specific questions about what was found during the examination.  If the procedure report does not answer your questions, please call your gastroenterologist to clarify.  If you requested that your care partner not be given the details of your procedure findings, then the procedure report has been included in a sealed envelope for you to review at your convenience later.  YOU SHOULD EXPECT: Some feelings of bloating in the abdomen. Passage of more gas than usual.  Walking can help get rid of the air that was put into your GI tract during the procedure and reduce the bloating. If you had a lower endoscopy (such as a colonoscopy or flexible sigmoidoscopy) you may notice spotting of blood in your stool or on the toilet paper. If you underwent a bowel prep for your procedure, you may not have a normal bowel movement for a few days.  Please Note:  You might notice some irritation and congestion in your nose or some drainage.  This is from the oxygen used during your procedure.  There is no need for concern and it should clear up in a day or so.  SYMPTOMS TO REPORT IMMEDIATELY:  Following lower endoscopy (colonoscopy or flexible sigmoidoscopy):  Excessive amounts of blood in the stool  Significant tenderness or worsening of abdominal pains  Swelling of the abdomen that is new, acute  Fever of 100F or higher  For urgent or emergent issues, a gastroenterologist can be reached at any hour by calling [phone]. Do not use MyChart messaging for urgent concerns.    DIET:  We do recommend a small meal at first, but then you may proceed to your regular diet.  Drink plenty of fluids but you should avoid alcoholic beverages for 24  hours.  ACTIVITY:  You should plan to take it easy for the rest of today and you should NOT DRIVE or use heavy machinery until tomorrow (because of the sedation medicines used during the test).    FOLLOW UP: Our staff will call the number listed on your records the next business day following your procedure.  We will call around 7:15- 8:00 am to check on you and address any questions or concerns that you may have regarding the information given to you following your procedure. If we do not reach you, we will leave a message.     If any biopsies were taken you will be contacted by phone or by letter within the next 1-3 weeks.  Please call us  at [phone] if you have not heard about the biopsies in 3 weeks.    SIGNATURES/CONFIDENTIALITY: You and/or your care partner have signed paperwork which will be entered into your electronic medical record.  These signatures attest to the fact that that the information above on your After Visit Summary has been reviewed and is understood.  Full responsibility of the confidentiality of this discharge information lies with you and/or your care-partner.

## 2024-04-05 NOTE — Progress Notes (Signed)
 [facility] History and Physical   Primary Care Physician:  Samie Frederick, PA-C   Reason for Procedure:   History of small bowel nodule / colon polyps   Plan:    colonoscopy     HPI: Lynn Thompson is a 73 y.o. female  here for colonoscopy surveillance - history of small bowel nodule - monitored since 2018, stable over time, negative PET, has declined surgical resection. Last colonoscopy 02/2021.   Patient denies any bowel symptoms at this time. Otherwise feels well without any cardiopulmonary symptoms.   I have discussed risks / benefits of anesthesia and endoscopic procedure with Beverley Jenkins Corn and they wish to proceed with the exams as outlined today.   The patient was provided an opportunity to ask questions and all were answered. The patient agreed with the plan.    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 Authorizing Provider  Ascorbic Acid (VITAMIN C) 1000 MG tablet Take 1,000 mg by mouth 2 (two) times daily.   Yes [provider]  aspirin EC 81 MG tablet Take 81 mg by mouth daily. Swallow whole.   Yes [provider]  Calcium Carbonate (CALCIUM 600 PO) Take 600 mg by mouth daily.   Yes [provider]  cholecalciferol (VITAMIN D3) 25 MCG (1000 UNIT) tablet Take 1,000 Units by mouth daily.   Yes [provider]  co-enzyme Q-10 30 MG capsule Take 30 mg by mouth daily.   Yes [provider]  dicyclomine  (BENTYL ) 10 MG capsule Take 1 capsule (10 mg total) by mouth every 8 (eight) hours as needed (abdominal cramping or pain). 11/22/23  Yes  Abran Norleen SAILOR, MD  famciclovir (FAMVIR) 500 MG tablet Take 500 mg by mouth daily as needed (viral outbreak).   Yes [provider]  irbesartan (AVAPRO) 75 MG tablet Take 75 mg by mouth at bedtime.   Yes [provider]  Misc Natural Products (TOTAL MEMORY & FOCUS FORMULA) TABS Take 1 tablet by mouth daily.   Yes [provider]  OVER THE COUNTER MEDICATION Take 1 tablet by mouth daily. Black current seed  oil   Yes [provider]  simvastatin (ZOCOR) 40 MG tablet Take 20 mg by mouth daily.   Yes [provider]  spironolactone (ALDACTONE) 25 MG tablet Take 25 mg by mouth daily.   Yes [provider]  acetaminophen (TYLENOL) 500 MG tablet Take 500 mg by mouth at bedtime. Patient taking differently: Take 500 mg by mouth as needed.    [provider]  ibuprofen (ADVIL) 200 MG tablet Take 400 mg by mouth every 6 (six) hours as needed for mild pain.    [provider]    Current Outpatient Medications  Medication Sig Dispense Refill   Ascorbic Acid (VITAMIN C) 1000 MG tablet Take 1,000 mg by mouth 2 (two) times daily.     aspirin EC 81 MG tablet Take 81 mg by mouth daily. Swallow whole.  Calcium Carbonate (CALCIUM 600 PO) Take 600 mg by mouth daily.     cholecalciferol (VITAMIN D3) 25 MCG (1000 UNIT) tablet Take 1,000 Units by mouth daily.     co-enzyme Q-10 30 MG capsule Take 30 mg by mouth daily.     dicyclomine  (BENTYL ) 10 MG capsule Take 1 capsule (10 mg total) by mouth every 8 (eight) hours as needed (abdominal cramping or pain). 30 capsule 1   famciclovir (FAMVIR) 500 MG tablet Take 500 mg by mouth daily as needed (viral outbreak).     irbesartan (AVAPRO) 75 MG tablet Take 75 mg by mouth at bedtime.     Misc Natural Products (TOTAL MEMORY & FOCUS FORMULA) TABS Take 1 tablet by mouth daily.     OVER THE COUNTER MEDICATION Take 1 tablet by mouth daily. Black current seed  oil     simvastatin (ZOCOR) 40 MG tablet Take  20 mg by mouth daily.     spironolactone (ALDACTONE) 25 MG tablet Take 25 mg by mouth daily.     acetaminophen (TYLENOL) 500 MG tablet Take 500 mg by mouth at bedtime. (Patient taking differently: Take 500 mg by mouth as needed.)     ibuprofen (ADVIL) 200 MG tablet Take 400 mg by mouth every 6 (six) hours as needed for mild pain.     Current Facility-Administered Medications  Medication Dose Route Frequency Provider Last Rate Last Admin   0.9 %  sodium chloride  infusion  500 mL Intravenous Once Mahkayla Preece, Elspeth SQUIBB, MD        Allergies as of 04/05/2024 - Review Complete 04/05/2024  Allergen Reaction Noted   Penicillins Rash 09/28/2017   Codeine Nausea And Vomiting 09/28/2017   Latex Rash 09/28/2017   Sulfa antibiotics Other (See Comments) 09/28/2017    Family History  Problem Relation Age of Onset   Diabetes Mother    Diabetes Father    High blood pressure Father    High Cholesterol Father    Diabetes Brother    Colon cancer Neg Hx    Esophageal cancer Neg Hx    Stomach cancer Neg Hx    Colon polyps Neg Hx    Rectal cancer Neg Hx    Breast cancer Neg Hx     Social History   Socioeconomic History   Marital status: Married    Spouse name: Not on file   Number of children: 0   Years of education: Not on file   Highest education level: Not on file  Occupational History   Occupation: Retired  Tobacco Use   Smoking status: Never   Smokeless tobacco: Never  Vaping Use   Vaping status: Never Used  Substance and Sexual Activity   Alcohol use: Yes    Alcohol/week: 4.0 standard drinks of alcohol    Types: 4 Glasses of wine per week   Drug use: No   Sexual activity: Not on file  Other Topics Concern   Not on file  Social History Narrative   Not on file   Social Drivers of Health   Financial Resource Strain: Low Risk  (01/12/2024)   Received from [facility]   Overall Financial Resource Strain (CARDIA)    How hard is it for you to pay for the very basics like food,  housing, medical care, and heating?: Not hard at all  Food Insecurity: No Food Insecurity (01/12/2024)   Received from [facility]   Hunger Vital Sign    Within the past 12 months, you worried that  your food would run out before you got the money to buy more.: Never true    Within the past 12 months, the food you bought just didn't last and you didn't have money to get more.: Never true  Transportation Needs: No Transportation Needs (01/12/2024)   Received from [facility] - Transportation    In the past 12 months, has lack of transportation kept you from medical appointments or from getting medications?: No    In the past 12 months, has lack of transportation kept you from meetings, work, or from getting things needed for daily living?: No  Physical Activity: Insufficiently Active (01/12/2024)   Received from [facility]   Exercise Vital Sign    On average, how many days per week do you engage in moderate to strenuous exercise (like a brisk walk)?: 2 days    On average, how many minutes do you engage in exercise at this level?: 60 min  Stress: No Stress Concern Present (01/12/2024)   Received from [facility] - Occupational Stress Questionnaire    Do you feel stress - tense, restless, nervous, or anxious, or unable to sleep at night because your mind is troubled all the time - these days?: Only a little  Social Connections: Socially Integrated (01/12/2024)   Received from [facility]   Social Network    How would you rate your social network (family, work, friends)?: Good participation with social networks  Intimate Partner Violence: Not At Risk (01/12/2024)   Received from [facility]   HITS    Over the last 12 months how often did your partner physically hurt you?: Never    Over the last 12 months how often did your partner insult you or talk down to you?: Never    Over the last 12 months how often did your partner threaten  you with physical harm?: Never    Over the last 12 months how often did your partner scream or curse at you?: Never    Review of Systems: All other review of systems negative except as mentioned in the HPI.  Physical Exam: Vital signs BP 114/66   Pulse 100   Temp 97.6 F (36.4 C) (Temporal)   Ht 5' 6 (1.676 m)   Wt 145 lb (65.8 kg)   SpO2 97%   BMI 23.40 kg/m   General:   Alert,  Well-developed, pleasant and cooperative in NAD Lungs:  Clear throughout to auscultation.   Heart:  Regular rate and rhythm Abdomen:  Soft, nontender and nondistended.   Neuro/Psych:  Alert and cooperative. Normal mood and affect. A and O x 3  Marcey Naval, MD [facility]

## 2024-04-05 NOTE — Progress Notes (Signed)
 To pacu, VSS. Report to [email or web address]

## 2024-04-05 NOTE — Progress Notes (Signed)
 Pt's states no medical or surgical changes since previsit or office visit.

## 2024-04-05 NOTE — Op Note (Signed)
 [facility] Patient Name: Lynn Thompson Procedure Date: 04/05/2024 9:30 AM MRN: [id number] Endoscopist: Elspeth P. Leigh , MD, [id number] Age: 73 Referring MD:  Date of Birth: March 06, 1951 Gender: Female Account #: [id number] Procedure:                Colonoscopy Indications:              High risk colon cancer surveillance: Personal                            history of polyps - small bowel subepithelial                            nodule surveyed over time - monitored since 2018                            without change in size, negative Dotatate PET, she                            has declined surgical resection. Last exam 02/2021. Medicines:                Monitored Anesthesia Care Procedure:                Pre-Anesthesia Assessment:                           - Prior to the procedure, a History and Physical                            was performed, and patient medications and                            allergies were reviewed. The patient's tolerance of                            previous anesthesia was also reviewed. The risks                            and benefits of the procedure and the sedation                            options and risks were discussed with the patient.                            All questions were answered, and informed consent                            was obtained. Prior Anticoagulants: The patient has                            taken no anticoagulant or antiplatelet agents. ASA                            Grade Assessment: II - A patient with mild systemic  disease. After reviewing the risks and benefits,                            the patient was deemed in satisfactory condition to                            undergo the procedure.                           After obtaining informed consent, the colonoscope                            was passed under direct vision. Throughout the                            procedure, the  patient's blood pressure, pulse, and                            oxygen saturations were monitored continuously. The                            Olympus Scope SN: [id number] was introduced through                            the anus and advanced to the the terminal ileum,                            with identification of the appendiceal orifice and                            IC valve. The colonoscopy was technically difficult                            and complex due to restricted mobility of the                            colon. The patient tolerated the procedure well.                            The quality of the bowel preparation was good. The                            terminal ileum, ileocecal valve, appendiceal                            orifice, and rectum were photographed. Scope In: 9:48:43 AM Scope Out: 10:10:03 AM Scope Withdrawal Time: 0 hours 13 minutes 56 seconds  Total Procedure Duration: 0 hours 21 minutes 20 seconds  Findings:                 The perianal and digital rectal examinations were                            normal.  The terminal ileum contained one sessile polyp. The                            polyp was 10 mm in diameter. No interval change in                            size. Bite on bite biopsies were taken with a cold                            forceps for histology.                           A 4 mm polyp was found in the descending colon. The                            polyp was sessile. The polyp was removed with a                            cold snare. Resection and retrieval were complete.                           Multiple diverticula were found in the sigmoid                            colon. There was associated restricted mobility and                            water immersion used to traverse this area slowly                            using pediatric colonoscope. Recommend future exams                            using ultraslim  pediatric colonoscope which I think                            would work better in this situation.                           The exam was otherwise without abnormality. Complications:            No immediate complications. Estimated blood loss:                            Minimal. Estimated Blood Loss:     Estimated blood loss was minimal. Impression:               - One subepithelial nodule in the terminal ileum.                            Stable over time. Biopsied.                           - One 4 mm polyp in the descending  colon, removed                            with a cold snare. Resected and retrieved.                           - Diverticulosis in the sigmoid colon with severely                            restricted mobility.                           - The examination was otherwise normal.                           Overall ileal lesion is stable over years - appears                            benign, will discuss with patient if she wishes to                            have any further surveillance or forgo it. Given                            difficulty traversing sigmoid colon may be                            reasonable to forgo further surveillance                            colonoscopy. Recommendation:           - Patient has a contact number available for                            emergencies. The signs and symptoms of potential                            delayed complications were discussed with the                            patient. Return to normal activities tomorrow.                            Written discharge instructions were provided to the                            patient.                           - Resume previous diet.                           - Continue present medications.                           - Await pathology  results. Elspeth P. Dimetri Armitage, MD 04/05/2024 10:18:09 AM This report has been signed electronically.

## 2024-04-05 NOTE — Progress Notes (Signed)
 Called to room to assist during endoscopic procedure.  Patient ID and intended procedure confirmed with present staff. Received instructions for my participation in the procedure from the performing physician.

## 2024-04-08 ENCOUNTER — Telehealth: Payer: Self-pay

## 2024-04-08 NOTE — Telephone Encounter (Signed)
  Follow up Call-     04/05/2024    8:56 AM  Call back number  Post procedure Call Back phone  # [phone]  Permission to leave phone message Yes     Patient questions:  Do you have a fever, pain , or abdominal swelling? No. Pain Score  0 *  Have you tolerated food without any problems? Yes.    Have you been able to return to your normal activities? Yes.    Do you have any questions about your discharge instructions: Diet   No. Medications  No. Follow up visit  No.  Do you have questions or concerns about your Care? No.  Actions: * If pain score is 4 or above: No action needed, pain <4.

## 2024-04-10 LAB — SURGICAL PATHOLOGY

## 2024-04-13 ENCOUNTER — Ambulatory Visit: Payer: Self-pay | Admitting: Gastroenterology
